# Patient Record
Sex: Female | Born: 1995 | Race: Black or African American | Hispanic: No | Marital: Single | Smoking: Never smoker
Health system: Southern US, Community
[De-identification: ages and names within clinical notes are randomized; demographics above are authoritative.]

## PROBLEM LIST (undated history)

## (undated) DIAGNOSIS — A6000 Herpesviral infection of urogenital system, unspecified: Secondary | ICD-10-CM

## (undated) DIAGNOSIS — T7840XA Allergy, unspecified, initial encounter: Secondary | ICD-10-CM

## (undated) DIAGNOSIS — R32 Unspecified urinary incontinence: Secondary | ICD-10-CM

## (undated) DIAGNOSIS — Z202 Contact with and (suspected) exposure to infections with a predominantly sexual mode of transmission: Secondary | ICD-10-CM

## (undated) HISTORY — DX: Contact with and (suspected) exposure to infections with a predominantly sexual mode of transmission: Z20.2

## (undated) HISTORY — DX: Herpesviral infection of urogenital system, unspecified: A60.00

## (undated) HISTORY — DX: Allergy, unspecified, initial encounter: T78.40XA

## (undated) HISTORY — DX: Unspecified urinary incontinence: R32

---
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
## 2015-11-19 ENCOUNTER — Emergency Department (HOSPITAL_COMMUNITY)
Admission: EM | Admit: 2015-11-19 | Discharge: 2015-11-19 | Disposition: A | Discharge status: Home / Self Care | Payer: Managed Care, Other (non HMO) | Source: Home / Self Care | Attending: Family Medicine | Admitting: Family Medicine

## 2015-11-19 ENCOUNTER — Other Ambulatory Visit (HOSPITAL_COMMUNITY)
Admission: RE | Admit: 2015-11-19 | Discharge: 2015-11-19 | Disposition: A | Discharge status: Home / Self Care | Payer: Managed Care, Other (non HMO) | Source: Ambulatory Visit | Attending: Family Medicine | Admitting: Family Medicine

## 2015-11-19 ENCOUNTER — Encounter (HOSPITAL_COMMUNITY): Payer: Self-pay | Admitting: *Deleted

## 2015-11-19 DIAGNOSIS — R69 Illness, unspecified: Secondary | ICD-10-CM

## 2015-11-19 DIAGNOSIS — B3731 Acute candidiasis of vulva and vagina: Secondary | ICD-10-CM

## 2015-11-19 DIAGNOSIS — B373 Candidiasis of vulva and vagina: Secondary | ICD-10-CM | POA: Diagnosis not present

## 2015-11-19 DIAGNOSIS — N760 Acute vaginitis: Secondary | ICD-10-CM

## 2015-11-19 DIAGNOSIS — Z113 Encounter for screening for infections with a predominantly sexual mode of transmission: Secondary | ICD-10-CM | POA: Diagnosis not present

## 2015-11-19 DIAGNOSIS — B9689 Other specified bacterial agents as the cause of diseases classified elsewhere: Secondary | ICD-10-CM

## 2015-11-19 DIAGNOSIS — A499 Bacterial infection, unspecified: Secondary | ICD-10-CM

## 2015-11-19 DIAGNOSIS — J111 Influenza due to unidentified influenza virus with other respiratory manifestations: Secondary | ICD-10-CM | POA: Diagnosis not present

## 2015-11-19 LAB — POCT URINALYSIS DIP (DEVICE)
Bilirubin Urine: NEGATIVE
Glucose, UA: NEGATIVE mg/dL
Ketones, ur: NEGATIVE mg/dL
NITRITE: NEGATIVE
PH: 7 (ref 5.0–8.0)
Protein, ur: 30 mg/dL — AB
Specific Gravity, Urine: 1.025 (ref 1.005–1.030)
Urobilinogen, UA: 0.2 mg/dL (ref 0.0–1.0)

## 2015-11-19 LAB — POCT PREGNANCY, URINE: Preg Test, Ur: NEGATIVE

## 2015-11-19 MED ORDER — METRONIDAZOLE 500 MG PO TABS: 500.0000 mg | ORAL_TABLET | Freq: Two times a day (BID) | ORAL | Status: DC

## 2015-11-19 MED ORDER — FLUCONAZOLE 150 MG PO TABS: 150.0000 mg | ORAL_TABLET | Freq: Once | ORAL | Status: DC

## 2015-11-19 MED ORDER — FLUCONAZOLE 150 MG PO TABS
150.0000 mg | ORAL_TABLET | Freq: Once | ORAL | Status: DC
Start: 2015-11-19 — End: 2015-11-19

## 2015-11-19 NOTE — ED Provider Notes (Signed)
CSN: [id number]     Arrival date & time 11/19/15  1307 History   First MD Initiated Contact with Patient 11/19/15 1412     Chief Complaint  Patient presents with  . Vaginal Discharge    HPI Sara Hutchinson is a 20 y.o. female presenting for vaginal irritation and discharge.   She reports 5 days of worsening vaginal discharge, burning, and itching. She tried miconazole topical last night and doesn't think that helped. Denies h/o STI's or yeast infections.  She is sexually active with female partners, uses condoms "80% of the time." Denies abdominal pain, but has last night developed dizziness, feeling hot (no temperature taken). She's had a cough and runny nose.   Past Medical History  Diagnosis Date  . Allergy    History reviewed. No pertinent past surgical history. History reviewed. No pertinent family history. Social History  Substance Use Topics  . Smoking status: Never Smoker   . Smokeless tobacco: Never Used  . Alcohol Use: 0.0 oz/week    0 Standard drinks or equivalent per week     Comment: rare   OB History    No data available     Review of Systems: As above  Allergies  Review of patient's allergies indicates no known allergies.  Home Medications   Prior to Admission medications   Not on File   Meds Ordered and Administered this Visit  Medications - No data to display  BP 128/77 mmHg  Pulse 111  Temp(Src) 100.5 F (38.1 C) (Oral)  Resp 16  SpO2 100%  LMP 11/12/2015  Physical Exam  Gen: Well-appearing 20 y.o.female in NAD CV: Tachycardic rate without murmur, good cap refill GI: +BS; soft, non-tender, non-distended Pelvic: External female genitalia without lesions, but excoriations. Vaginal mucosa and cervix normal without lesions. Cloudy discharge with thick white particulate pooled in vault. No blood. No cervical motion tenderness or masses.  Arcola Jansky, EMT present throughout duration of exam.   ED Course  Procedures (including critical care  time)  Labs Review Labs Reviewed  POCT URINALYSIS DIP (DEVICE) - Abnormal; Notable for the following:    Hgb urine dipstick TRACE (*)    Protein, ur 30 (*)    Leukocytes, UA LARGE (*)    All other components within normal limits  POCT PREGNANCY, URINE   Imaging Review No results found.  MDM   1. BV (bacterial vaginosis)   2. Vulvovaginal candidiasis   3. Influenza-like illness    Treat empirically for yeast and BV based on symptoms and consistent exam findings. Wet prep and GC/Chl pending. Will call with positive results. UA nonspecific without signs of infection. Dysuria likely related to urethral irritation from candida.   Patient likely has concomitant influenza like illness cause mild fever and tachycardia. Strongly doubt PID with no CMT/cervical discharge on exam or abd pain. Urged to return if she develops abdominal pain or worsening discharge.   This patient's case was discussed with the attending provider who examined the patient and helped formulate the plan of care.   Tyrone Nine, MD 11/19/15 [phone]

## 2015-11-19 NOTE — ED Notes (Addendum)
Pt  States   Slight  Vaginal  Discharge      X  2  Days       Pt  Was   Seen  Health  Dept    6  Feb   Had  Negative      Tests     Pt is  Here  Now     For  Possible  Yeast    Infection        Pt  Reports         Symptoms  Of  Vaginal burning         And  Irritation   Noted   She  Reports  Has  Small  Bumps  Present in the  Area

## 2015-11-19 NOTE — Discharge Instructions (Signed)
Take diflucan to treat the yeast infection. Repeat treatment in 7 days if symptoms persist. Take flagyl twice a day for 7 days to treat bacterial vaginosis. If you start feeling very sick, you should return for care.

## 2015-11-20 LAB — CERVICOVAGINAL ANCILLARY ONLY
Chlamydia: NEGATIVE
NEISSERIA GONORRHEA: NEGATIVE
WET PREP (BD AFFIRM): POSITIVE — AB

## 2015-11-21 IMAGING — CR DG KNEE COMPLETE 4+V*L*
3 series · 3 of 3 positions shown · non-contrast
Comparison: None.

CLINICAL DATA: Twisting injury left knee 08/05/2014 will walking.
Left knee pain.

EXAM:
LEFT KNEE - COMPLETE 4+ VIEW

[AP]
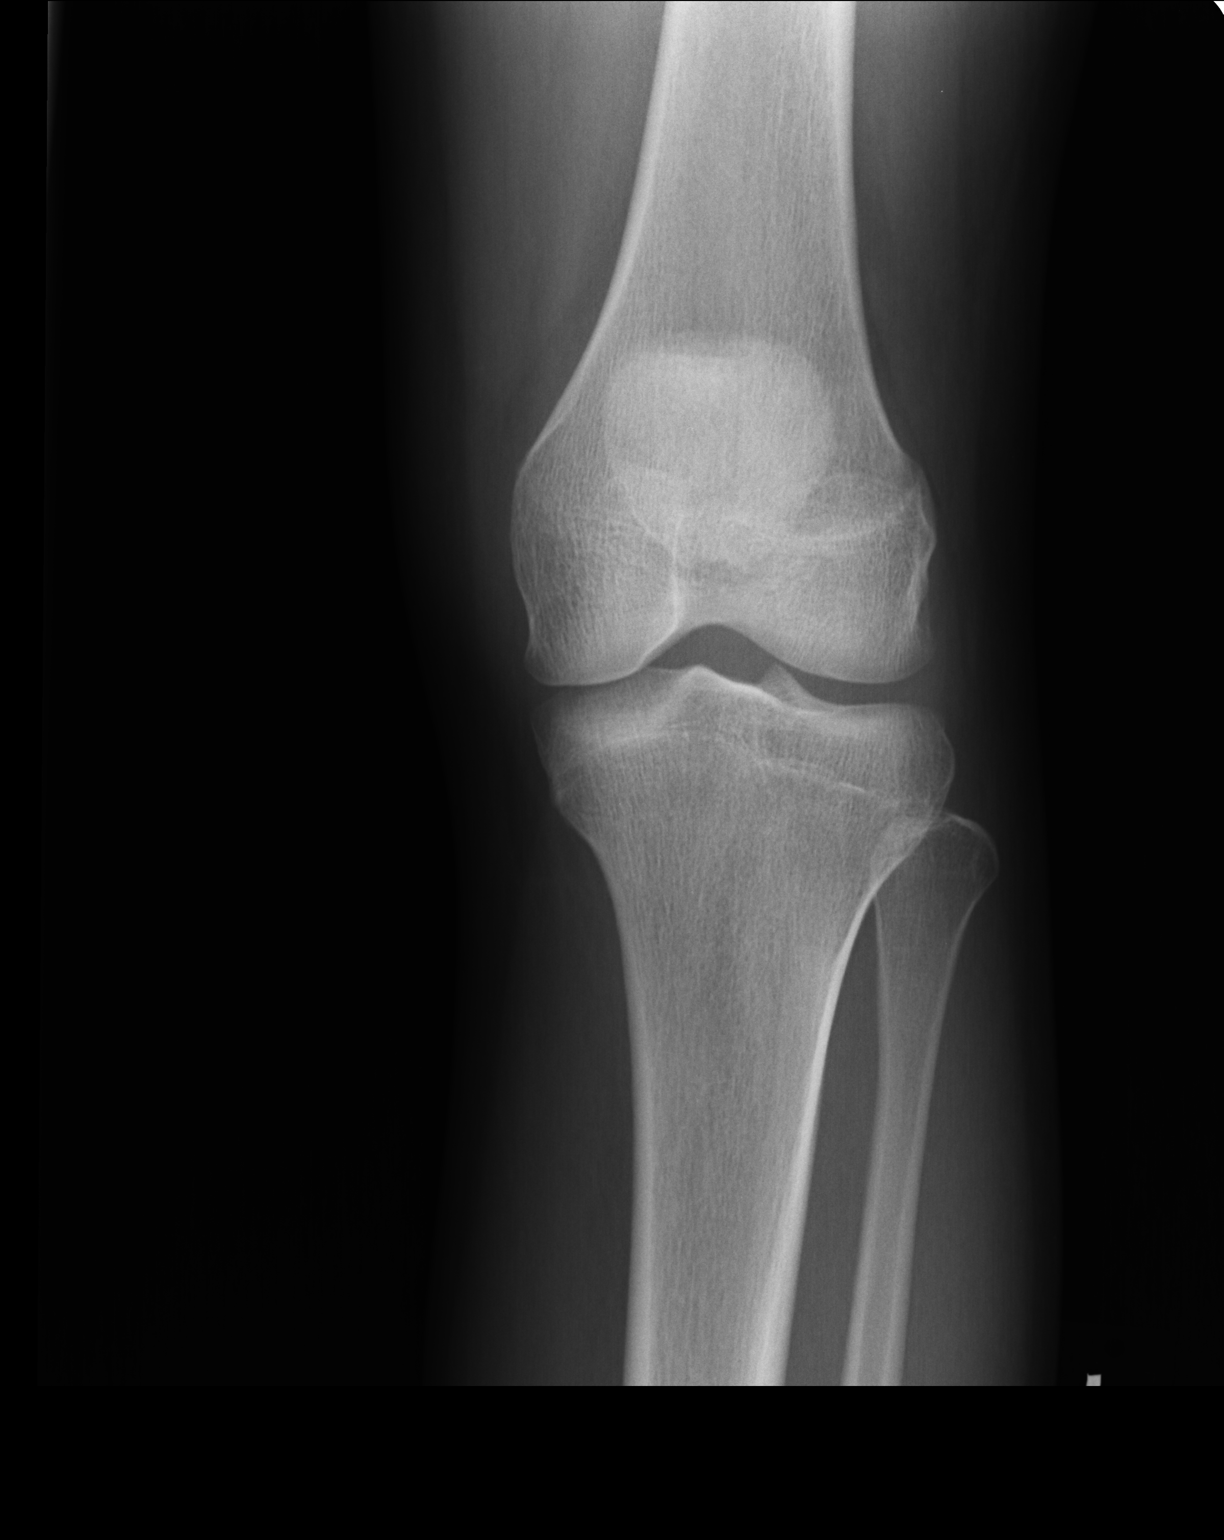

[lateral]
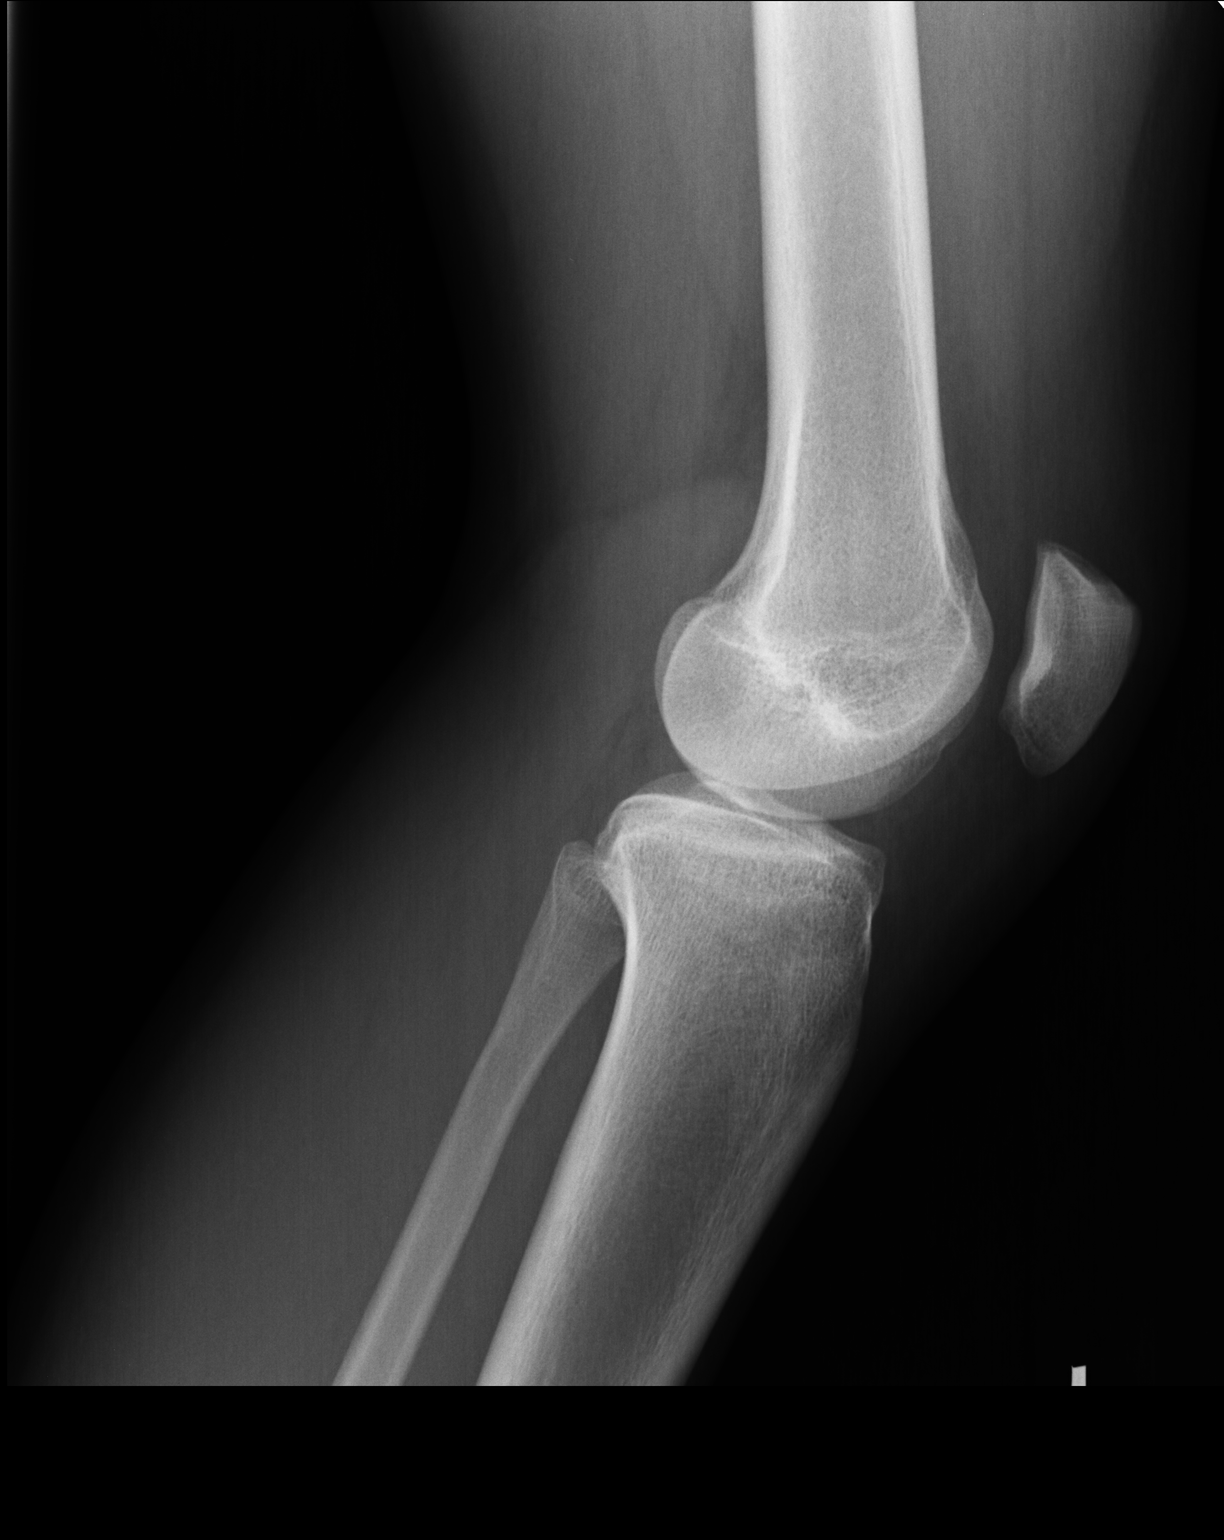

[ap ext rot]
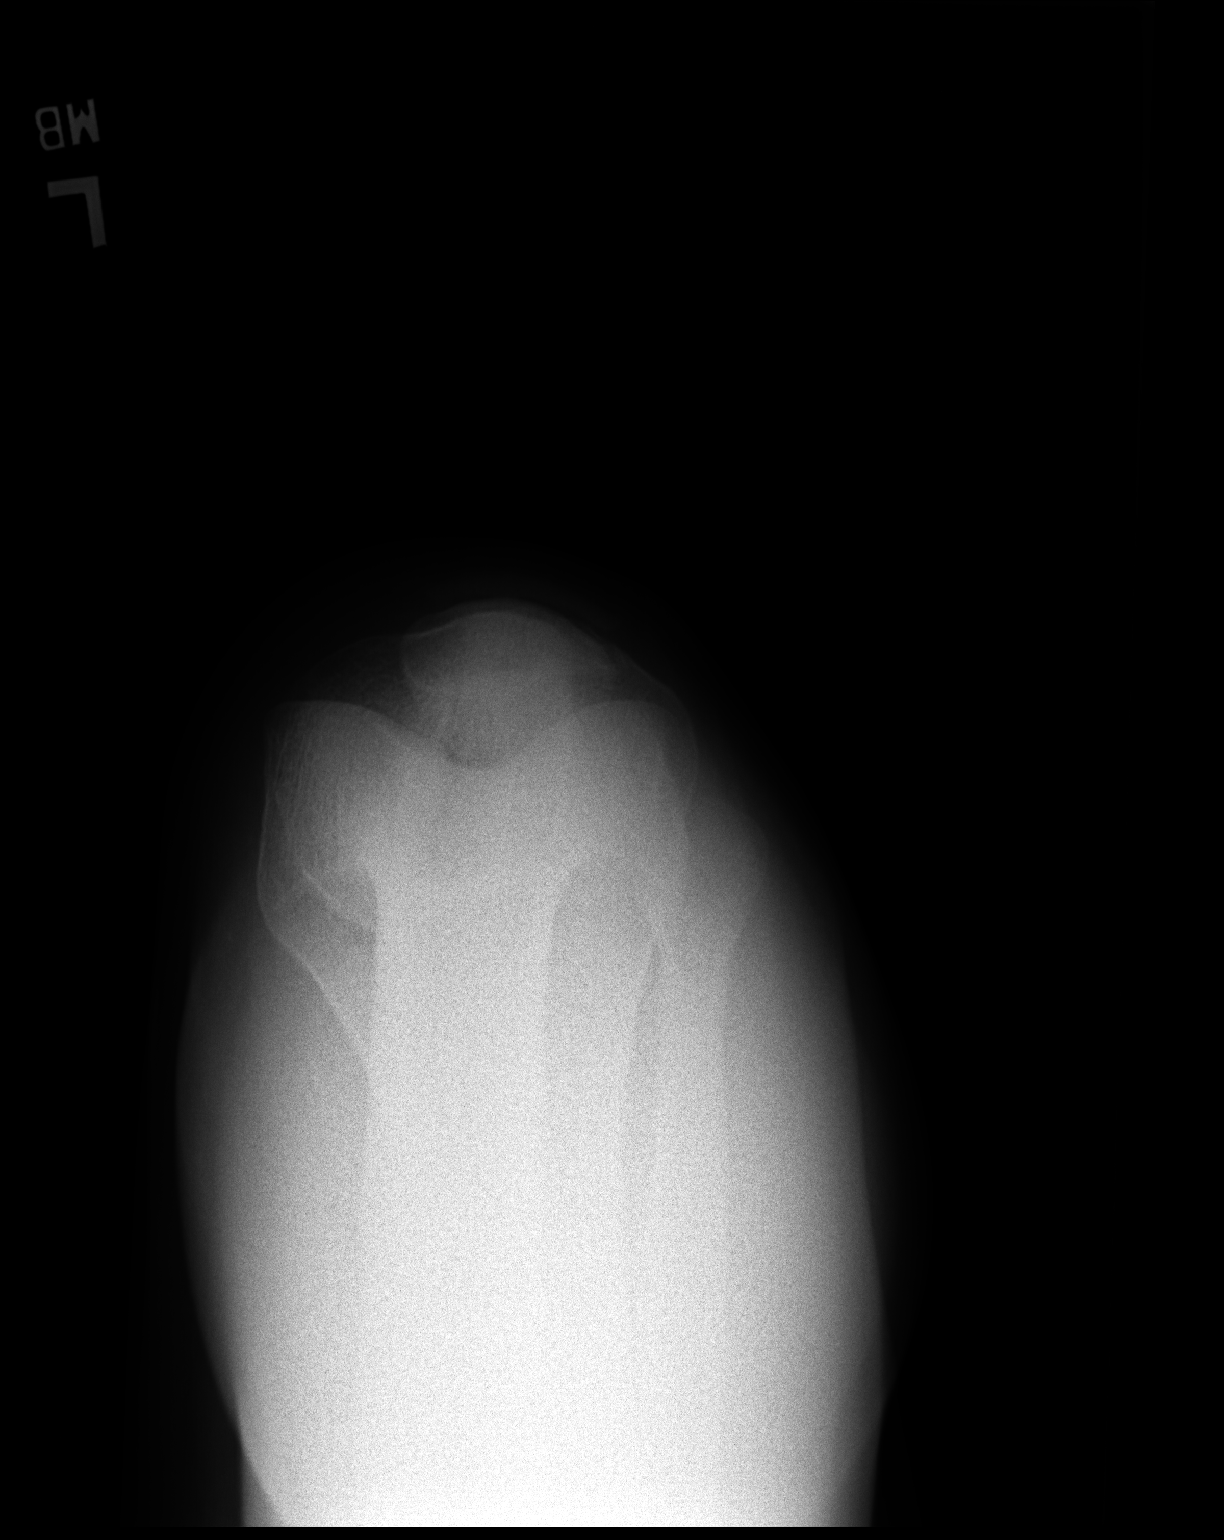

[3 of 3 positions shown; findings below may reference images not displayed]

FINDINGS: Imaged bones, joints and soft tissues appear normal.
IMPRESSION: Normal exam.

## 2015-11-23 ENCOUNTER — Telehealth (HOSPITAL_COMMUNITY): Payer: Self-pay | Admitting: *Deleted

## 2015-11-25 ENCOUNTER — Ambulatory Visit (INDEPENDENT_AMBULATORY_CARE_PROVIDER_SITE_OTHER): Payer: Managed Care, Other (non HMO) | Admitting: Obstetrics and Gynecology

## 2015-11-25 ENCOUNTER — Encounter: Payer: Self-pay | Admitting: Obstetrics and Gynecology

## 2015-11-25 VITALS — BP 118/70 | HR 80 | Resp 14 | Wt 120.0 lb

## 2015-11-25 DIAGNOSIS — A6000 Herpesviral infection of urogenital system, unspecified: Secondary | ICD-10-CM

## 2015-11-25 DIAGNOSIS — R300 Dysuria: Secondary | ICD-10-CM

## 2015-11-25 LAB — POCT URINALYSIS DIPSTICK
Bilirubin, UA: NEGATIVE
Blood, UA: NEGATIVE
Glucose, UA: NEGATIVE
Ketones, UA: NEGATIVE
NITRITE UA: NEGATIVE
PH UA: 8
PROTEIN UA: NEGATIVE
UROBILINOGEN UA: NEGATIVE

## 2015-11-25 MED ORDER — OXYCODONE-ACETAMINOPHEN 5-325 MG PO TABS: 1.0000 | ORAL_TABLET | ORAL | Status: DC | PRN

## 2015-11-25 MED ORDER — LIDOCAINE 5 % EX OINT: TOPICAL_OINTMENT | CUTANEOUS | Status: DC

## 2015-11-25 MED ORDER — VALACYCLOVIR HCL 1 G PO TABS: 1000.0000 mg | ORAL_TABLET | Freq: Two times a day (BID) | ORAL | Status: DC

## 2015-11-25 NOTE — Progress Notes (Signed)
Patient ID: Sara Hutchinson, female   DOB: 10-29-1995, 20 y.o.   MRN: [id number] GYNECOLOGY  VISIT   HPI: 20 y.o.   Single  African American  female   G0P0000 with Patient's last menstrual period was 11/06/2015.   here c/o severe vaginal irritation and dysuria. She was seen in the ER last week and treated for BV. She stated the bumps started about a week ago. She c/o increasing pain over the last several days. It was so painful to walk, the vulva is swollen. She has missed school. It hurts a lot to void. She feels she empties her bladder.  Last weeks she had flu like symptoms  GYNECOLOGIC HISTORY: Patient's last menstrual period was 11/06/2015. Contraception:none Menopausal hormone therapy: none         OB History    Gravida Para Term Preterm AB TAB SAB Ectopic Multiple Living           There are no active problems to display for this patient.   Past Medical History  Diagnosis Date  . Allergy   . Urinary incontinence     History reviewed. No pertinent past surgical history.  Current Outpatient Prescriptions  Medication Sig Dispense Refill  . metroNIDAZOLE (FLAGYL) 500 MG tablet Take 1 tablet (500 mg total) by mouth 2 (two) times daily. 14 tablet 0   No current facility-administered medications for this visit.     ALLERGIES: Review of patient's allergies indicates no known allergies.  History reviewed. No pertinent family history.  Social History   Social History  . Marital Status: Single    Spouse Name: N/A  . Number of Children: N/A  . Years of Education: N/A   Occupational History  . Not on file.   Social History Main Topics  . Smoking status: Never Smoker   . Smokeless tobacco: Never Used  . Alcohol Use: 0.0 oz/week    0 Standard drinks or equivalent per week     Comment: rare  . Drug Use: Yes    Special: Marijuana  . Sexual Activity:    Partners: Male   Other Topics Concern  . Not on file   Social History Narrative    Review of  Systems  Constitutional: Negative.   HENT: Negative.   Eyes: Negative.   Respiratory: Negative.   Cardiovascular: Negative.   Gastrointestinal: Negative.   Genitourinary: Positive for dysuria.       Vaginal irritation Vaginal discharge   Musculoskeletal: Negative.   Skin: Negative.   Neurological: Negative.   Endo/Heme/Allergies: Negative.   Psychiatric/Behavioral: Negative.     PHYSICAL EXAMINATION:    BP 118/70 mmHg  Pulse 80  Resp 14  Wt 120 lb (54.432 kg)  LMP 11/06/2015    General appearance: alert, cooperative and appears stated age  Pelvic: External genitalia:  The vulva and clitoris are swollen, multiple ulcers throughout, c/w primary HSV outbreak. Very painful to try and spread the vulva. Speculum exam was not performed              Chaperone was present for exam.  ASSESSMENT Primary HSV outbreak    PLAN Discussed voiding in a sitz bath Valtrex x 10 days Lidocaine ointment prn Can use Vaseline to the vulva as well Percocet for pain She understand if she can't void she needs to be seen  Discussed HSV, information given   An After Visit Summary was printed and given to the patient.  25 minutes  face to face time of which over 50% was spent in counseling.

## 2015-11-25 NOTE — Patient Instructions (Signed)

## 2015-11-26 ENCOUNTER — Telehealth (HOSPITAL_COMMUNITY): Payer: Self-pay | Admitting: Emergency Medicine

## 2015-11-26 NOTE — ED Notes (Signed)
Attempt  LM on pt's VM Need to give lab results from recent visit on   Per Dr. Dayton Scrape,    Will try later.  Faxed documentation to [facility]

## 2015-11-27 LAB — HERPES SIMPLEX VIRUS CULTURE: ORGANISM ID, BACTERIA: DETECTED

## 2015-11-28 NOTE — ED Notes (Unsigned)
Called pt and notified of recent lab results from visit 2/21 Pt ID'd properly... Reports feeling better and sx have subsided Also reports she went to GYN and was dx w/HSV  Per Dr. Dayton Scrape,  Test for gardnerella (bacterial vaginosis) positive. Pt received rx for metronidazole at [facility] visit 11/19/15. Recheck for persistent symptoms.  Tests for gonorrhea/chlamydia were negative. Please let patient know. LM  Adv pt if sx are not getting better to return  Pt verb understanding Education on safe sex given

## 2016-02-27 DIAGNOSIS — A6000 Herpesviral infection of urogenital system, unspecified: Secondary | ICD-10-CM

## 2016-02-27 HISTORY — DX: Herpesviral infection of urogenital system, unspecified: A60.00

## 2016-05-23 ENCOUNTER — Ambulatory Visit (HOSPITAL_COMMUNITY)
Admission: EM | Admit: 2016-05-23 | Discharge: 2016-05-23 | Disposition: A | Discharge status: Home / Self Care | Payer: Managed Care, Other (non HMO) | Attending: Physician Assistant | Admitting: Physician Assistant

## 2016-05-23 ENCOUNTER — Encounter (HOSPITAL_COMMUNITY): Payer: Self-pay | Admitting: Emergency Medicine

## 2016-05-23 DIAGNOSIS — IMO0002 Reserved for concepts with insufficient information to code with codable children: Secondary | ICD-10-CM

## 2016-05-23 NOTE — Discharge Instructions (Signed)
Do not submerge in water for 1 day, then ok to shower. Keep clean and let the steri-strips come off on their own. F/U if any signs of infection.

## 2016-05-23 NOTE — ED Triage Notes (Signed)
Patient reports stepping on glass around 3:00 pm.  Piece of glass went through shoe and into the bottom of left foot, ball of the foot.  Patient reports pulling out glass and does not think there is any glass in wound.  Patient concerned for depth of wound and circumstances surrounding incident.  Currently bleeding controlled.  Patient did take a shower after returning from dumpster.

## 2016-05-23 NOTE — ED Provider Notes (Signed)
CSN: [id number]     Arrival date & time 05/23/16  1630 History   None    Chief Complaint  Patient presents with  . Laceration   (Consider location/radiation/quality/duration/timing/severity/associated sxs/prior Treatment) Patient presents with a laceration to the left foot. She stepping on a piece of glass at work, went through the slide. She pulled glass out. Bleeding has subsided.       Past Medical History:  Diagnosis Date  . Allergy   . Urinary incontinence    History reviewed. No pertinent surgical history. History reviewed. No pertinent family history. Social History  Substance Use Topics  . Smoking status: Never Smoker  . Smokeless tobacco: Never Used  . Alcohol use 0.0 oz/week     Comment: rare   OB History    Gravida Para Term Preterm AB Living   0 0 0 0 0 0   SAB TAB Ectopic Multiple Live Births   0 0 0 0       Review of Systems  All other systems reviewed and are negative.   Allergies  Review of patient's allergies indicates no known allergies.  Home Medications   Prior to Admission medications   Medication Sig Start Date End Date Taking? Authorizing Provider  lidocaine (XYLOCAINE) 5 % ointment Use a pea sized amount TID as needed 11/25/15   Romualdo Bolk, MD  metroNIDAZOLE (FLAGYL) 500 MG tablet Take 1 tablet (500 mg total) by mouth 2 (two) times daily. 11/19/15   Tyrone Nine, MD  oxyCODONE-acetaminophen (PERCOCET) 5-325 MG tablet Take 1-2 tablets by mouth every 4 (four) hours as needed. use only as much as needed to relieve pain 11/25/15   Romualdo Bolk, MD  valACYclovir (VALTREX) 1000 MG tablet Take 1 tablet (1,000 mg total) by mouth 2 (two) times daily. Take for 10 days 11/25/15   Romualdo Bolk, MD   Meds Ordered and Administered this Visit  Medications - No data to display  BP 134/80 (BP Location: Left Arm)   Pulse 96   Temp 99.2 F (37.3 C) (Oral)   Resp 16   SpO2 100%  No data found.   Physical Exam  Constitutional: She  is oriented to person, place, and time. She appears well-developed and well-nourished.  Neurological: She is alert and oriented to person, place, and time.  Skin: Skin is warm and dry.  2-3 cm superficial laceration to the sole of left foot, no FB is noted  Psychiatric: Her behavior is normal.  Nursing note and vitals reviewed.   Urgent Care Course   Clinical Course    .Marland KitchenLaceration Repair Date/Time: 05/23/2016 7:00 PM Performed by: Riki Sheer Authorized by: Riki Sheer   Consent:    Consent obtained:  Verbal   Consent given by:  Patient Anesthesia (see MAR for exact dosages):    Anesthesia method:  None Laceration details:    Location:  Foot   Foot location:  Sole of L foot   Length (cm):  3 Repair type:    Repair type:  Simple Exploration:    Contaminated: no   Treatment:    Area cleansed with:  Betadine   Amount of cleaning:  Standard Skin repair:    Repair method:  Tissue adhesive Approximation:    Approximation:  Close Post-procedure details:    Dressing:  Adhesive bandage   (including critical care time)  Labs Review Labs Reviewed - No data to display  Imaging Review No results found.   Visual Acuity Review  Right Eye Distance:   Left Eye Distance:   Bilateral Distance:    Right Eye Near:   Left Eye Near:    Bilateral Near:         MDM   1. Laceration   Simple closer with dermabond. Wound care discussed. F/U if needed. Td is up to date.    Riki SheerMichelle G Shylin Keizer, PA-C 05/23/16 1921

## 2016-05-25 ENCOUNTER — Telehealth (HOSPITAL_COMMUNITY): Payer: Self-pay | Admitting: Emergency Medicine

## 2016-05-25 NOTE — Telephone Encounter (Signed)
Pt called and wanted to know if she can get crutches from her visit on 8/26... Told her she can and also gave her some other options.  Reports she will call around and if not, she will come here to get crutches.

## 2016-07-02 ENCOUNTER — Encounter: Payer: Self-pay | Admitting: Obstetrics and Gynecology

## 2016-09-28 DIAGNOSIS — Z202 Contact with and (suspected) exposure to infections with a predominantly sexual mode of transmission: Secondary | ICD-10-CM

## 2016-09-28 HISTORY — DX: Contact with and (suspected) exposure to infections with a predominantly sexual mode of transmission: Z20.2

## 2016-10-09 ENCOUNTER — Ambulatory Visit (INDEPENDENT_AMBULATORY_CARE_PROVIDER_SITE_OTHER): Payer: Managed Care, Other (non HMO) | Admitting: Obstetrics & Gynecology

## 2016-10-09 ENCOUNTER — Telehealth: Payer: Self-pay | Admitting: Obstetrics and Gynecology

## 2016-10-09 ENCOUNTER — Encounter: Payer: Self-pay | Admitting: Obstetrics & Gynecology

## 2016-10-09 VITALS — BP 100/60 | HR 60 | Resp 14 | Ht 66.0 in | Wt 127.0 lb

## 2016-10-09 DIAGNOSIS — N898 Other specified noninflammatory disorders of vagina: Secondary | ICD-10-CM

## 2016-10-09 DIAGNOSIS — N938 Other specified abnormal uterine and vaginal bleeding: Secondary | ICD-10-CM

## 2016-10-09 DIAGNOSIS — Z7251 High risk heterosexual behavior: Secondary | ICD-10-CM

## 2016-10-09 MED ORDER — IBUPROFEN 800 MG PO TABS: 800.0000 mg | ORAL_TABLET | Freq: Three times a day (TID) | ORAL | 0 refills | Status: DC | PRN

## 2016-10-09 NOTE — Telephone Encounter (Signed)
Spoke with patient. Patient tearful. Patient states she was sexually active 10/03/16 with no protection. Patient states she took plan B on 10/04/16. Patient reports LMP 09/30/16. Patient states after taking plan B she experienced diarrhea for 2 days, tired and continued to bleed when she thought cycle should be over. Patient states she has missed class today d/t a continue right sided stomach pain she has been experiencing when she moves. Patient states pain is 5/10 and is sharp with movement. Patient states she is not sure where to go or what to do. Patient states she has spoken with friends that have taken plan b and have not had these type of side effects. Reviewed common side effects of plan b with patient: nausea, diarrhea, dizziness, fatigue, breast pain-tenderness, changes in cycles and headache. Recommenced OV for further evaluation. Advised patient Dr. Oscar LaJertson is out of the office today, can schedule with covering provider. Patient scheduled for 1/12 at 2pm with Dr. Hyacinth MeekerMiller. Patient is agreeable to date and time. Last OV 11/25/15.  Routing to provider for final review. Patient is agreeable to disposition. Will close encounter.  Cc: Dr. Oscar LaJertson

## 2016-10-09 NOTE — Progress Notes (Signed)
. GYNECOLOGY  VISIT   HPI: 21 y.o. G0P0000 Single African American female with complaint of right sided pain that started two or three days after taking plan B on Sunday.    She reports she was using a condom when she had intercourse but the condom broke and she decided to use Plan B.  Reports she had some diarrhea and fatigue after taking this.  She said she slept for "hours" for several days.  Has felt some right sided pain (right side beneath ribs) and wonders if this was due to "all the sleep".  She has take some OTC Advil and this has helped.  She thought her LMP was 09/30/16 but reports it was very light for a few days.  Then after the plan B, the bleeding has increased.  She is wearing a pad today.  Denies pain.  Reports she really "dislikes having to have an exam while bleeding".  Voiced understanding.    Reports she had a little nausea and headache for a day after the plan B but that has resolved.  She does feel like she's had recurrent BV symptoms.  Has been treated earlier this year.  Pt cannot tell about symptoms today because she is having bleeding but felt the BV was there before the bleeding started.  She's had two or three partners since last STD testing.  Recommend she proceed today.  GYNECOLOGIC HISTORY: Patient's last menstrual period was 09/30/2016. Contraception:  none  There are no active problems to display for this patient.   Past Medical History:  Diagnosis Date  . Allergy   . Urinary incontinence     History reviewed. No pertinent surgical history.  MEDS:  Reviewed in EPIC and UTD  ALLERGIES: Patient has no known allergies.  History reviewed. No pertinent family history.  SH:  Single, non smoker  Review of Systems  All other systems reviewed and are negative.   PHYSICAL EXAMINATION:    BP 100/60 (BP Location: Right Arm, Patient Position: Sitting, Cuff Size: Normal)   Pulse 60   Resp 14   Ht 5\' 6"  (1.676 m)   Wt 127 lb (57.6 kg)   LMP  09/30/2016   BMI 20.50 kg/m     General appearance: alert, cooperative and appears stated age Abdomen: soft, non-tender; bowel sounds normal; no masses,  no organomegaly  Pelvic: External genitalia:  no lesions              Urethra:  normal appearing urethra with no masses, tenderness or lesions              Bartholins and Skenes: normal                 Vagina: normal appearing vagina with normal color and discharge, no lesions, blood present              Cervix: no lesions              Bimanual Exam:  Uterus:  normal size, contour, position, consistency, mobility, non-tender              Adnexa: no mass, fullness, tenderness              Anus:  no lesions  Chaperone was present for exam.  Assessment: DUB after Plan B Unprotected intercourse with pregnancy STD exposure Possible BV H/O genital herpes  Plan: Motrin 800mg  every 8 hours prn UPT.  Pt aware if neg, should repeat in one week with OTC pregnancy  test. GC/Chl obtained.  Declines blood work for HIV/RPR today. Affirm pending.  Results will be called to pt.  ~25 minutes spent with patient >50% of time was in face to face discussion of above.  Pt needed lots of reassurance today regarding risks and testing that should be done.

## 2016-10-09 NOTE — Telephone Encounter (Signed)
Patient needs to speak with a nurse states she took Plan B on Sunday morning.  States she was on her period when she took the pill and her period has lasted 4 days longer than normal.  Says she felt fine all day Sunday but began having loose stool Sunday night that went on for 3 days with cramping.  The past 2 days she has had a sharp pain in her right side that is constant.  She wants to know what to do and is this nomal.

## 2016-10-10 ENCOUNTER — Encounter: Payer: Self-pay | Admitting: Obstetrics & Gynecology

## 2016-10-10 LAB — GC/CHLAMYDIA PROBE AMP
CT Probe RNA: DETECTED — AB
GC Probe RNA: NOT DETECTED

## 2016-10-10 LAB — WET PREP BY MOLECULAR PROBE
CANDIDA SPECIES: NEGATIVE
GARDNERELLA VAGINALIS: POSITIVE — AB
TRICHOMONAS VAG: NEGATIVE

## 2016-10-12 ENCOUNTER — Other Ambulatory Visit: Payer: Self-pay | Admitting: *Deleted

## 2016-10-12 DIAGNOSIS — Z113 Encounter for screening for infections with a predominantly sexual mode of transmission: Secondary | ICD-10-CM

## 2016-10-12 MED ORDER — METRONIDAZOLE 500 MG PO TABS: 500.0000 mg | ORAL_TABLET | Freq: Two times a day (BID) | ORAL | 0 refills | Status: DC

## 2016-10-12 MED ORDER — AZITHROMYCIN 1 G PO PACK: 1.0000 g | PACK | Freq: Once | ORAL | 0 refills | Status: AC

## 2016-10-20 ENCOUNTER — Other Ambulatory Visit (INDEPENDENT_AMBULATORY_CARE_PROVIDER_SITE_OTHER): Payer: Managed Care, Other (non HMO)

## 2016-10-20 DIAGNOSIS — Z113 Encounter for screening for infections with a predominantly sexual mode of transmission: Secondary | ICD-10-CM

## 2016-10-21 LAB — STD PANEL
HIV: NONREACTIVE
Hepatitis B Surface Ag: NEGATIVE

## 2017-01-12 ENCOUNTER — Ambulatory Visit: Payer: Self-pay | Admitting: Obstetrics & Gynecology

## 2017-01-12 ENCOUNTER — Encounter: Payer: Self-pay | Admitting: Obstetrics & Gynecology

## 2017-01-12 ENCOUNTER — Telehealth: Payer: Self-pay | Admitting: Obstetrics & Gynecology

## 2017-01-12 NOTE — Telephone Encounter (Signed)
Patient forgot her appointment for "TOC chlamydia today." I called her and she said, "Oh, I forgot about that." Patient rescheduled to 01/15/17 with Dr. Hyacinth Meeker. I took the opportunity to remind her of our appointment policy as well.  Routing to Dr. Hyacinth Meeker for FYI only. Closing encounter.

## 2017-01-15 ENCOUNTER — Ambulatory Visit (INDEPENDENT_AMBULATORY_CARE_PROVIDER_SITE_OTHER): Payer: Managed Care, Other (non HMO) | Admitting: Obstetrics & Gynecology

## 2017-01-15 ENCOUNTER — Encounter: Payer: Self-pay | Admitting: Obstetrics & Gynecology

## 2017-01-15 VITALS — BP 100/68 | HR 100 | Resp 16 | Ht 66.0 in | Wt 129.0 lb

## 2017-01-15 DIAGNOSIS — Z8619 Personal history of other infectious and parasitic diseases: Secondary | ICD-10-CM

## 2017-01-15 NOTE — Progress Notes (Signed)
Dictation #1 [id number]  [id number]:[id number] GYNECOLOGY  VISIT   HPI: 21 y.o. G0P0000 Single African American female here for follow-up after having positive chlamydia infection noted in January.  She is sexually active.  Partner did not go an get tested or treated.  She states they always use condoms.  Then she states she thinks she  "might" have forgotten.  She did take the antibiotics without any issues.    Still feels she has discharge.  No pelvic pain.  Denies fever.  Denies urinary symptoms.  Declines using something else for contraception.  GYNECOLOGIC HISTORY: Patient's last menstrual period was 12/29/2016. Contraception: condoms  Menopausal hormone therapy: None  There are no active problems to display for this patient.   Past Medical History:  Diagnosis Date  . Allergy   . 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 Urinary incontinence     No past surgical history on file.  MEDS:  Reviewed in EPIC and UTD  ALLERGIES: Patient has no known allergies.  No family history on file.  SH:  Single, non smoker  Review of Systems  All other systems reviewed and are negative.   PHYSICAL EXAMINATION:    BP 100/68 (BP Location: Right Arm, Patient Position: Sitting, Cuff Size: Normal)   Pulse 100   Resp 16   Ht  (1.676 m)   Wt 129 lb (58.5 kg)   LMP 12/29/2016   BMI 20.82 kg/m     General appearance: alert, cooperative and appears stated age CV:  Regular rate and rhythm Lungs:  clear to auscultation, no wheezes, rales or rhonchi, symmetric air entry Abdomen: soft, non-tender; bowel sounds normal; no masses,  no organomegaly  Pelvic: External genitalia:  no lesions              Urethra:  normal appearing urethra with no masses, tenderness or lesions              Bartholins and Skenes: normal                 Vagina: normal appearing vagina with normal color and discharge, no lesions              Cervix: no lesions              Bimanual Exam:  Uterus:  normal  size, contour, position, consistency, mobility, non-tender              Adnexa: no mass, fullness, tenderness              Anus:  no lesions  Chaperone was present for exam.  Assessment: h/o chlamydia infection Unprotected intercourse Declines changes in contraception  Plan: GC/Chl obtained today

## 2017-01-16 LAB — GC/CHLAMYDIA PROBE AMP
CT Probe RNA: NOT DETECTED
GC Probe RNA: NOT DETECTED

## 2019-07-26 ENCOUNTER — Other Ambulatory Visit: Payer: Self-pay | Admitting: Obstetrics and Gynecology

## 2019-07-26 ENCOUNTER — Ambulatory Visit: Payer: Medicaid Other | Admitting: Obstetrics and Gynecology

## 2019-07-26 ENCOUNTER — Other Ambulatory Visit: Payer: Self-pay

## 2019-07-26 ENCOUNTER — Encounter: Payer: Self-pay | Admitting: Obstetrics and Gynecology

## 2019-07-26 VITALS — BP 122/80 | HR 76 | Temp 97.4°F | Wt 129.2 lb

## 2019-07-26 DIAGNOSIS — N898 Other specified noninflammatory disorders of vagina: Secondary | ICD-10-CM

## 2019-07-26 DIAGNOSIS — N760 Acute vaginitis: Secondary | ICD-10-CM | POA: Diagnosis not present

## 2019-07-26 DIAGNOSIS — Z3009 Encounter for other general counseling and advice on contraception: Secondary | ICD-10-CM

## 2019-07-26 DIAGNOSIS — B9689 Other specified bacterial agents as the cause of diseases classified elsewhere: Secondary | ICD-10-CM

## 2019-07-26 DIAGNOSIS — Z113 Encounter for screening for infections with a predominantly sexual mode of transmission: Secondary | ICD-10-CM

## 2019-07-26 MED ORDER — ULIPRISTAL ACETATE 30 MG PO TABS: 1.0000 | ORAL_TABLET | Freq: Once | ORAL | 0 refills | Status: AC

## 2019-07-26 MED ORDER — METRONIDAZOLE 0.75 % VA GEL: 1.0000 | Freq: Every day | VAGINAL | 0 refills | Status: DC

## 2019-07-26 NOTE — Patient Instructions (Addendum)
Vaginitis Vaginitis is a condition in which the vaginal tissue swells and becomes red (inflamed). This condition is most often caused by a change in the normal balance of bacteria and yeast that live in the vagina. This change causes an overgrowth of certain bacteria or yeast, which causes the inflammation. There are different types of vaginitis, but the most common types are:  Bacterial vaginosis.  Yeast infection (candidiasis).  Trichomoniasis vaginitis. This is a sexually transmitted disease (STD).  Viral vaginitis.  Atrophic vaginitis.  Allergic vaginitis. What are the causes? The cause of this condition depends on the type of vaginitis. It can be caused by:  Bacteria (bacterial vaginosis).  Yeast, which is a fungus (yeast infection).  A parasite (trichomoniasis vaginitis).  A virus (viral vaginitis).  Low hormone levels (atrophic vaginitis). Low hormone levels can occur during pregnancy, breastfeeding, or after menopause.  Irritants, such as bubble baths, scented tampons, and feminine sprays (allergic vaginitis). Other factors can change the normal balance of the yeast and bacteria that live in the vagina. These include:  Antibiotic medicines.  Poor hygiene.  Diaphragms, vaginal sponges, spermicides, birth control pills, and intrauterine devices (IUD).  Sex.  Infection.  Uncontrolled diabetes.  A weakened defense (immune) system. What increases the risk? This condition is more likely to develop in women who:  Smoke.  Use vaginal douches, scented tampons, or scented sanitary pads.  Wear tight-fitting pants.  Wear thong underwear.  Use oral birth control pills or an IUD.  Have sex without a condom.  Have multiple sex partners.  Have an STD.  Frequently use the spermicide nonoxynol-9.  Eat lots of foods high in sugar.  Have uncontrolled diabetes.  Have low estrogen levels.  Have a weakened immune system from an immune disorder or medical  treatment.  Are pregnant or breastfeeding. What are the signs or symptoms? Symptoms vary depending on the cause of the vaginitis. Common symptoms include:  Abnormal vaginal discharge. ? The discharge is white, gray, or yellow with bacterial vaginosis. ? The discharge is thick, white, and cheesy with a yeast infection. ? The discharge is frothy and yellow or greenish with trichomoniasis.  A bad vaginal smell. The smell is fishy with bacterial vaginosis.  Vaginal itching, pain, or swelling.  Sex that is painful.  Pain or burning when urinating. Sometimes there are no symptoms. How is this diagnosed? This condition is diagnosed based on your symptoms and medical history. A physical exam, including a pelvic exam, will also be done. You may also have other tests, including:  Tests to determine the pH level (acidity or alkalinity) of your vagina.  A whiff test, to assess the odor that results when a sample of your vaginal discharge is mixed with a potassium hydroxide solution.  Tests of vaginal fluid. A sample will be examined under a microscope. How is this treated? Treatment varies depending on the type of vaginitis you have. Your treatment may include:  Antibiotic creams or pills to treat bacterial vaginosis and trichomoniasis.  Antifungal medicines, such as vaginal creams or suppositories, to treat a yeast infection.  Medicine to ease discomfort if you have viral vaginitis. Your sexual partner should also be treated.  Estrogen delivered in a cream, pill, suppository, or vaginal ring to treat atrophic vaginitis. If vaginal dryness occurs, lubricants and moisturizing creams may help. You may need to avoid scented soaps, sprays, or douches.  Stopping use of a product that is causing allergic vaginitis. Then using a vaginal cream to treat the symptoms. Follow   these instructions at home: Lifestyle  Keep your genital area clean and dry. Avoid soap, and only rinse the area with  water.  Do not douche or use tampons until your health care provider says it is okay to do so. Use sanitary pads, if needed.  Do not have sex until your health care provider approves. When you can return to sex, practice safe sex and use condoms.  Wipe from front to back. This avoids the spread of bacteria from the rectum to the vagina. General instructions  Take over-the-counter and prescription medicines only as told by your health care provider.  If you were prescribed an antibiotic medicine, take or use it as told by your health care provider. Do not stop taking or using the antibiotic even if you start to feel better.  Keep all follow-up visits as told by your health care provider. This is important. How is this prevented?  Use mild, non-scented products. Do not use things that can irritate the vagina, such as fabric softeners. Avoid the following products if they are scented: ? Feminine sprays. ? Detergents. ? Tampons. ? Feminine hygiene products. ? Soaps or bubble baths.  Let air reach your genital area. ? Wear cotton underwear to reduce moisture buildup. ? Avoid wearing underwear while you sleep. ? Avoid wearing tight pants and underwear or nylons without a cotton panel. ? Avoid wearing thong underwear.  Take off any wet clothing, such as bathing suits, as soon as possible.  Practice safe sex and use condoms. Contact a health care provider if:  You have abdominal pain.  You have a fever.  You have symptoms that last for more than 2-3 days. Get help right away if:  You have a fever and your symptoms suddenly get worse. Summary  Vaginitis is a condition in which the vaginal tissue becomes inflamed.This condition is most often caused by a change in the normal balance of bacteria and yeast that live in the vagina.  Treatment varies depending on the type of vaginitis you have.  Do not douche, use tampons , or have sex until your health care provider approves. When  you can return to sex, practice safe sex and use condoms. This information is not intended to replace advice given to you by your health care provider. Make sure you discuss any questions you have with your health care provider. Document Released: 07/12/2007 Document Revised: 08/27/2017 Document Reviewed: 10/20/2016 Elsevier Patient Education  2020 [employer]. Emergency Contraception Emergency contraception refers to birth control methods that prevent pregnancy after unprotected sex. Emergency contraception may be recommended:  When a condom breaks.  After a sexual assault.  If you forgot to take your birth control pill.  If you used inadequate contraception during sex. Emergency contraception is most effective if used as soon as possible after sex. It is important to note that it does not protect against STIs (sexually transmitted infections). Do not use emergency contraception as your only form of birth control. Types of emergency contraceptives Emergency contraception must be used within 5 days after having unprotected sex. The following types of emergency contraception are available:  Hormonal pills that work by preventing the ovaries from releasing an egg (ovulation) and preventing fertilization of an egg. There are two types of hormonal pills: ? A pill that contains high doses of estrogen and progesterone. ? A pill that contains only progesterone. This may be a single pill or two pills taken 12-24 hours apart.  A non-hormonal pill that works by preventing  progesterone from having its normal effect on ovulation and the lining of the uterus (endometrium). A prescription for this medicine is usually required.  A non-hormonal medical device that is inserted into the uterus (copper intrauterine device, IUD). The copper in the IUD causes the sperm to be less able to fertilize the egg. A health care provider must insert the IUD. Most types of emergency contraceptives are available without  a prescription or a visit with your health care provider. If you are younger than 59, you may need a prescription. Talk with your pharmacist about your options. Side effects Ask your health care provider about the possible side effects of emergency contraceptives. Side effects may include:  Abdominal pain and cramps.  Nausea and vomiting.  Breast tenderness.  Headache.  Dizziness.  Fatigue.  Irregular bleeding or spotting. If you take emergency contraception while you are pregnant, it will not end your pregnancy or harm your baby. Follow these instructions at home:  Take over-the-counter and prescription medicines only as told by your health care provider.  Eat something before taking the emergency contraception pills. This can help prevent nausea.  If you feel tired or dizzy, rest until you feel better.  Continue using your normal method of birth control. Contact a health care provider if:  You vomit within 2 hours after taking a pill. You may need to take another pill.  You have a severe headache.  You have bleeding that does not stop.  It has been 21 days since you took an emergency contraception pill and you have not had a menstrual period. Get help right away if:  You have chest pain.  You have leg pain.  You have numbness or weakness of your arms or legs.  You have slurred speech.  You have visual problems. Summary  Emergency contraceptives prevent pregnancy after unprotected sex.  Emergency contraception will not work if you are already pregnant and will not harm the baby if you are pregnant.  Some emergency contraceptives are available from your local pharmacy without a prescription.  Talk to your health care provider about the type of emergency contraceptives that are best for you. This information is not intended to replace advice given to you by your health care provider. Make sure you discuss any questions you have with your health care provider.  Document Released: 11/23/2001 Document Revised: 08/27/2017 Document Reviewed: 10/27/2016 Elsevier Patient Education  2020 [employer].

## 2019-07-26 NOTE — Addendum Note (Signed)
Addended by: Gwendlyn Deutscher on: 07/26/2019 02:42 PM   Modules accepted: Orders

## 2019-07-26 NOTE — Progress Notes (Signed)
GYNECOLOGY  VISIT   HPI: 23 y.o.   Single Black or African American Not Hispanic or Latino  female   G0P0000 with Patient's last menstrual period was 07/13/2019 (exact date).   here for STD testing.  She wants to get tested every 3 months. Mostly uses condoms.  She has noticed a slight vaginal odor occasionally.   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   GYNECOLOGIC HISTORY: Patient's last menstrual period was 07/13/2019 (exact date). Contraception:Condoms Menopausal hormone therapy: None        OB History    Gravida  0   Para  0   Term  0   Preterm  0   AB  0   Living  0     SAB  0   TAB  0   Ectopic  0   Multiple  0   Live Births                 There are no active problems to display for this patient.   Past Medical History:  Diagnosis Date  . Allergy   . Chlamydia contact, treated 09/2016  . 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 Urinary incontinence     History reviewed. No pertinent surgical history.  Current Outpatient Medications  Medication Sig Dispense Refill  . ibuprofen (ADVIL,MOTRIN) 800 MG tablet Take 1 tablet (800 mg total) by mouth every 8 (eight) hours as needed. 30 tablet 0   No current facility-administered medications for this visit.      ALLERGIES: Patient has no known allergies.  History reviewed. No pertinent family history.  Social History   Socioeconomic History  . Marital status: Single    Spouse name: Not on file  . Number of children: Not on file  . Years of education: Not on file  . Highest education level: Not on file  Occupational History  . Not on file  Social Needs  . Financial resource strain: Not on file  . Food insecurity    Worry: Not on file    Inability: Not on file  . Transportation needs    Medical: Not on file    Non-medical: Not on file  Tobacco Use  . Smoking status: Never Smoker  . Smokeless tobacco:  Never Used  Substance and Sexual Activity  . Alcohol use: Yes    Alcohol/week: 0.0 standard drinks    Comment: rarely  . Drug use: Yes    Types: Marijuana  . Sexual activity: Yes    Partners: Male    Birth control/protection: Condom  Lifestyle  . Physical activity    Days per week: Not on file    Minutes per session: Not on file  . Stress: Not on file  Relationships  . Social Herbalist on phone: Not on file    Gets together: Not on file    Attends religious service: Not on file    Active member of club or organization: Not on file    Attends meetings of clubs or organizations: Not on file    Relationship status: Not on file  . Intimate partner violence    Fear of current or ex partner: Not on file    Emotionally abused: Not on file    Physically abused: Not on file    Forced sexual activity: Not on file  Other Topics Concern  . Not  on file  Social History Narrative  . Not on file    Review of Systems  Constitutional: Negative.   HENT: Negative.   Eyes: Negative.   Respiratory: Negative.   Cardiovascular: Negative.   Gastrointestinal: Negative.   Genitourinary: Negative.   Musculoskeletal: Negative.   Skin: Negative.   Neurological: Negative.   Endo/Heme/Allergies: Negative.   Psychiatric/Behavioral: Negative.     PHYSICAL EXAMINATION:    BP 122/80 (BP Location: Right Arm, Patient Position: Sitting, Cuff Size: Normal)   Pulse 76   Temp (!) 97.4 F (36.3 C) (Skin)   Wt 129 lb 3.2 oz (58.6 kg)   LMP 07/13/2019 (Exact Date)   BMI 20.85 kg/m     General appearance: alert, cooperative and appears stated age  Pelvic: External genitalia:  Small skin tag on the upper right labia majora and upper, inner right thigh.              Urethra:  normal appearing urethra with no masses, tenderness or lesions              Bartholins and Skenes: normal                 Vagina: normal appearing vagina with an increase in watery, white vaginal discharge               Cervix: no lesions               Chaperone was present for exam.  Wet prep: ++ clue, no trich, no wbc KOH: no yeast PH: 5   ASSESSMENT Screening STD Bacterial vaginitis Contraception, using condoms.     PLAN Screening std's Treat for BV Continue to use condoms, prescribed Ella as morning after contraception   An After Visit Summary was printed and given to the patient.  ~20 minutes face to face time of which over 50% was spent in counseling.

## 2019-07-27 LAB — HEP, RPR, HIV PANEL
HIV Screen 4th Generation wRfx: NONREACTIVE
Hepatitis B Surface Ag: NEGATIVE
RPR Ser Ql: NONREACTIVE

## 2019-07-27 LAB — HEPATITIS C ANTIBODY: Hep C Virus Ab: <0.1 s/co ratio (ref 0.0–0.9)

## 2019-07-28 LAB — CHLAMYDIA/GONOCOCCUS/TRICHOMONAS, NAA
Chlamydia by NAA: NEGATIVE
Gonococcus by NAA: NEGATIVE
Trich vag by NAA: NEGATIVE

## 2019-07-31 ENCOUNTER — Telehealth: Payer: Self-pay | Admitting: Obstetrics and Gynecology

## 2019-07-31 NOTE — Telephone Encounter (Signed)
Patient returning call.

## 2019-08-29 ENCOUNTER — Telehealth: Payer: Self-pay | Admitting: Obstetrics and Gynecology

## 2019-08-29 NOTE — Telephone Encounter (Signed)
Spoke with patient. Patient was tx with flagyl for BV on 07/26/19. Completed medication, vaginal d/c returned approximately 2 days after. Reports white vaginal d/c with odpr, not as heavy as before. Denies vaginal itching, bleeding, pain or irregular bleeding. Advised patient OV needed for further evaluation, Covid19 prescreen negative, precautions reviewed. Patient request early morning appt this week, any available provider. Offered OV on 12/2 at 4:30pm with Dr. Talbert Nan, patient declined due to her work schedule. OV scheduled for 12/2 at 9:30am with Sara Hutchinson, CNM.   Routing to provider for final review. Patient is agreeable to disposition. Will close encounter.  Cc: Sara Hutchinson, CNM

## 2019-08-29 NOTE — Telephone Encounter (Signed)
Patient last seen 07/26/19. Treated for BV. States after finishing antibiotic, she is still feeling symptoms. Would like to discuss what she should do next and whether she needs to be seen again.

## 2019-08-30 ENCOUNTER — Encounter: Payer: Self-pay | Admitting: Certified Nurse Midwife

## 2019-08-30 ENCOUNTER — Ambulatory Visit: Payer: Medicaid Other | Admitting: Certified Nurse Midwife

## 2019-08-30 ENCOUNTER — Other Ambulatory Visit: Payer: Self-pay

## 2019-08-30 VITALS — BP 100/60 | HR 72 | Temp 97.2°F | Resp 16 | Wt 126.0 lb

## 2019-08-30 DIAGNOSIS — Z113 Encounter for screening for infections with a predominantly sexual mode of transmission: Secondary | ICD-10-CM

## 2019-08-30 DIAGNOSIS — N898 Other specified noninflammatory disorders of vagina: Secondary | ICD-10-CM | POA: Diagnosis not present

## 2019-08-30 NOTE — Progress Notes (Signed)
23 y.o. Single African American female G0P0000 here with complaint of cloudy white odorous discharge, denies itching or burning with current discharge. Onset of symptoms 10 days ago. Denies new personal products. She was treated with Flagyl for BV on and felt it had cleared until this noted. Has had a partner change and unprotected sex. Would like to have STD screening. Urinary symptoms none noted . Contraception is condoms, mostly consistent. Would like information regarding other options. No other health issues today.  Review of Systems  Constitutional: Negative.   HENT: Negative.   Eyes: Negative.   Respiratory: Negative.   Cardiovascular: Negative.   Genitourinary: Negative for dysuria, frequency and urgency.       Vaginal discharge with odor  Musculoskeletal: Negative.   Skin: Negative for itching and rash.       ? Flea bites or dry skin  Neurological: Negative.   Endo/Heme/Allergies: Negative.   Psychiatric/Behavioral: Negative.     O:Healthy female WDWN Affect: normal, orientation x 3  Exam: Skin:Warm and dry, small resolving occasional papular skin bump noted on axilla area, also noted on left inside of leg appears to be healing boil, no exudate or redness. Abdomen:soft, non tender, no masses  Inguinal Lymph nodes: no enlargement or tenderness Pelvic exam: External genital: normal female BUS: negative Vagina: copious white slight odorous discharge noted.  Affirm taken Cervix: normal, non tender, no CMT, GC/Chlamydia screen taken Uterus: normal, non tender Adnexa:normal, non tender, no masses or fullness noted   A:Normal pelvic exam Contraception condoms most of the time Odorous vaginal discharge, recently treated BV Partner change with unprotected sexual activity STD screening Papular skin rash resolving? Flea bites( has cat)    P:Discussed findings of vaginal discharge and possible  Etiology of yeast or irritation with new partner.. Discussed Aveeno or baking soda  sitz bath for comfort.  Discussed importance of condom use for STD protection and pregnancy prevention. Discussed importance of consistent use. Labs: Affirm, Gc, Chlamydia, HIV, RPR, Hep C Discussed skin finding and encouraged to do epsom salt tub bath should help resolve. Warnings given. Patient to bathe cat. Given printed information of contraceptive choices to consider.  Rv prn

## 2019-08-30 NOTE — Patient Instructions (Signed)
Preventing Sexually Transmitted Infections, Adult Sexually transmitted infections (STIs) are diseases that are passed (transmitted) from person to person through bodily fluids exchanged during sex or sexual contact. Bodily fluids include saliva, semen, blood, vaginal mucus, and urine. You may have an increased risk for developing an STI if you have unprotected oral, vaginal, or anal sex. Some common STIs include:  Herpes.  Hepatitis B.  Chlamydia.  Gonorrhea.  Syphilis.  HPV (human papillomavirus).  HIV (human immunodeficiency virus), the virus that can cause AIDS (acquired immunodeficiency syndrome). How can I protect myself from sexually transmitted infections? The only way to completely prevent STIs is not to have sex of any kind (practice abstinence). This includes oral, vaginal, or anal sex. If you are sexually active, take these actions to lower your risk of getting an STI:  Have only one sex partner (be monogamous) or limit the number of sexual partners you have.  Stay up-to-date on immunizations. Certain vaccines can lower your risk of getting certain STIs, such as: ? Hepatitis A and B vaccines. You may have been vaccinated as a young child, but likely need a booster shot as a teen or young adult. ? HPV vaccine.  Use methods that prevent the exchange of body fluids between partners (barrier protection) every time you have sex. Barrier protection can be used during oral, vaginal, or anal sex. Commonly used barrier methods include: ? Female condom. ? Female condom. ? Dental dam.  Get tested regularly for STIs. Have your sexual partner get tested regularly as well.  Avoid mixing alcohol, drugs, and sex. Alcohol and drug use can affect your ability to make good decisions and can lead to risky sexual behaviors.  Ask your health care provider about taking pre-exposure prophylaxis (PrEP) to prevent HIV infection if you: ? Have a HIV-positive sexual partner. ? Have multiple sexual  partners or partners who do not know their HIV status, and do not regularly use a condom during sex. ? Use injection drugs and share needles. Birth control pills, injections, implants, and intrauterine devices (IUDs) do not protect against STIs. To prevent both STIs and pregnancy, always use a condom with another form of birth control. Some STIs, such as herpes, are spread through skin to skin contact. A condom does not protect you from getting such STIs. If you or your partner have herpes and there is an active flare with open sores, avoid all sexual contact. Why are these changes important? Taking steps to practice safe sex protects you and others. Many STIs can be cured. However, some STIs are not curable and will affect you for the rest of your life. STIs can be passed on to another person even if you do not have symptoms. What can happen if changes are not made? Certain STIs may:  Require you to take medicine for the rest of your life.  Affect your ability to have children (your fertility).  Increase your risk for developing another STI or certain serious health conditions, such as: ? Cervical cancer. ? Head and neck cancer. ? Pelvic inflammatory disease (PID) in women. ? Organ damage or damage to other parts of your body, if the infection spreads.  Be passed to a baby during childbirth. How are sexually transmitted infections treated? If you or your partner know or think that you may have an STI:  Talk with your health care provider about what can be done to treat it. Some STIs can be treated and cured with medicines.  For curable STIs, you and   your partner should avoid sex during treatment and for several days after treatment is complete.  You and your partner should both be treated at the same time, if there is any chance that your partner is infected as well. If you get treatment but your partner does not, your partner can re-infect you when you resume sexual contact.  Do not  have unprotected sex. Where to find more information Learn more about sexually transmitted diseases and infections from:  Centers for Disease Control and Prevention: ? More information about specific STIs: www.cdc.gov/std ? Find places to get sexual health counseling and treatment for free or for a low cost: gettested.cdc.gov  [place] Department of Health and Human Services: www.womenshealth.gov/publications/our-publications/fact-sheet/sexually-transmitted-infections.html Summary  The only way to completely prevent STIs is not to have sex (practice abstinence), including oral, vaginal, or anal sex.  STIs can spread through saliva, semen, blood, vaginal mucus, urine, or sexual contact.  If you do have sex, limit your number of sexual partners and use a barrier protection method every time you have sex.  If you develop an STI, get treated right away and ask your partner to be treated as well. Do not resume having sex until both of you have completed treatment for the STI. This information is not intended to replace advice given to you by your health care provider. Make sure you discuss any questions you have with your health care provider. Document Released: 09/10/2016 Document Revised: 02/18/2018 Document Reviewed: 09/10/2016 Elsevier Patient Education  2020 [employer]  

## 2019-08-31 LAB — GC/CHLAMYDIA PROBE AMP
Chlamydia trachomatis, NAA: NEGATIVE
Neisseria Gonorrhoeae by PCR: NEGATIVE

## 2019-08-31 LAB — VAGINITIS/VAGINOSIS, DNA PROBE
Candida Species: NEGATIVE
Gardnerella vaginalis: POSITIVE — AB
Trichomonas vaginosis: NEGATIVE

## 2019-08-31 LAB — RPR: RPR Ser Ql: NONREACTIVE

## 2019-08-31 LAB — HEPATITIS C ANTIBODY: Hep C Virus Ab: <0.1 s/co ratio (ref 0.0–0.9)

## 2019-08-31 LAB — HIV ANTIBODY (ROUTINE TESTING W REFLEX): HIV Screen 4th Generation wRfx: NONREACTIVE

## 2019-09-01 ENCOUNTER — Other Ambulatory Visit: Payer: Self-pay

## 2019-09-01 MED ORDER — METRONIDAZOLE 500 MG PO TABS: 500.0000 mg | ORAL_TABLET | Freq: Two times a day (BID) | ORAL | 0 refills | Status: DC

## 2019-10-16 ENCOUNTER — Ambulatory Visit: Payer: Medicaid Other | Admitting: Certified Nurse Midwife

## 2019-10-16 ENCOUNTER — Encounter: Payer: Self-pay | Admitting: Certified Nurse Midwife

## 2019-10-16 ENCOUNTER — Other Ambulatory Visit: Payer: Self-pay

## 2019-10-16 VITALS — BP 120/70 | HR 68 | Temp 98.2°F | Resp 16 | Wt 129.0 lb

## 2019-10-16 DIAGNOSIS — R350 Frequency of micturition: Secondary | ICD-10-CM

## 2019-10-16 DIAGNOSIS — Z3009 Encounter for other general counseling and advice on contraception: Secondary | ICD-10-CM

## 2019-10-16 DIAGNOSIS — Z113 Encounter for screening for infections with a predominantly sexual mode of transmission: Secondary | ICD-10-CM

## 2019-10-16 LAB — POCT URINALYSIS DIPSTICK
Bilirubin, UA: NEGATIVE
Blood, UA: NEGATIVE
Glucose, UA: NEGATIVE
Ketones, UA: NEGATIVE
Leukocytes, UA: NEGATIVE
Nitrite, UA: NEGATIVE
Protein, UA: NEGATIVE
Urobilinogen, UA: NEGATIVE E.U./dL — AB
pH, UA: 5 (ref 5.0–8.0)

## 2019-10-16 NOTE — Progress Notes (Signed)
  24 y.o. Single African American female G0P0000 here with complaint of frequent urination, no odor to urine,  ? UTI, with onset on over the past few days.. Patient denies of urinary frequency, but has increase fluids/urgency/ and pain with urination. Patient denies fever, chills, nausea or back pain. No new personal products. Patient feels not related to sexual activity. Denies any vaginal symptoms of increase discharge or itching or burning. Contraception is condoms. Only sexual active one week ago, which may have been ovulation..Menopausal with vaginal dryness. Patient now drinking adequate water intake. LMP 09/29/2019. Consistent partner with STD screening negative.  Review of Systems  Constitutional: Negative.   HENT: Negative.   Eyes: Negative.   Respiratory: Negative.   Cardiovascular: Negative.   Gastrointestinal: Negative.   Genitourinary: Positive for frequency.       Pelvic pressure  Musculoskeletal: Negative.   Skin: Negative.   Neurological: Negative.   Endo/Heme/Allergies: Negative.   Psychiatric/Behavioral: Negative.     O: Healthy female WDWN Affect: Normal, orientation x 3 Skin : warm and dry CVAT: Negative bilateral Abdomen: soft no masses, tenderness, negative  for suprapubic tenderness  Pelvic exam: External genital area: normal, no lesions Bladder,Urethra non tender, Urethral meatus:non tender Vagina: normal appearing vaginal discharge, normal appearance  Cervix: normal, non tender Uterus:normal,non tender Adnexa: normal non tender, no fullness or masses Rectal area: normal appearance  poct urine-neg A: Normal pelvic exam STD concerns, STD screening requested Unprotected sexual activity Urinary frequency with recent increase in caffeine, no other symptoms Contraception needed  P: Reviewed findings of normal pelvic exam and no indication of UTI. Discussed decreasing caffeine to stop the frequency. Increase water and cranberry juice. Encouraged to limit soda,  tea, and coffee and be sure to increase water intake. Discussed STD prevention and pregnancy prevention with consistent condom use or contraception . Patient has booklet on options has not decided yet. Stressed condom use. If no menses do home UPT when period due and advise. Labs: Affirm, HIV,RPR,Hep C, GC, Chlamydia Questions addressed at length.   RV prn

## 2019-10-16 NOTE — Patient Instructions (Signed)

## 2019-10-17 ENCOUNTER — Telehealth: Payer: Self-pay | Admitting: Certified Nurse Midwife

## 2019-10-17 ENCOUNTER — Other Ambulatory Visit: Payer: Self-pay

## 2019-10-17 LAB — GC/CHLAMYDIA PROBE AMP
Chlamydia trachomatis, NAA: NEGATIVE
Neisseria Gonorrhoeae by PCR: NEGATIVE

## 2019-10-17 LAB — HEPATITIS C ANTIBODY: Hep C Virus Ab: <0.1 s/co ratio (ref 0.0–0.9)

## 2019-10-17 LAB — VAGINITIS/VAGINOSIS, DNA PROBE
Candida Species: NEGATIVE
Gardnerella vaginalis: POSITIVE — AB
Trichomonas vaginosis: NEGATIVE

## 2019-10-17 LAB — RPR: RPR Ser Ql: NONREACTIVE

## 2019-10-17 LAB — HIV ANTIBODY (ROUTINE TESTING W REFLEX): HIV Screen 4th Generation wRfx: NONREACTIVE

## 2019-10-17 MED ORDER — METRONIDAZOLE 500 MG PO TABS: 500.0000 mg | ORAL_TABLET | Freq: Two times a day (BID) | ORAL | 0 refills | Status: DC

## 2019-10-17 NOTE — Telephone Encounter (Signed)
I spoke to patient & sent in medication to [facility]. Okay to close encounter once viewed. Routing to Carmelina Dane, Charity fundraiser

## 2019-10-17 NOTE — Telephone Encounter (Signed)
Sara Hutchinson, [facility]  Phone Number: [phone]  I have a question about VAGINITIS/VAGINOSIS, DNA PROBE resulted on 10/17/19, 09:36.   I thought my discharge seemed normal. Nonetheless, may I get my prescription today and [facility] one on [facility]?

## 2019-10-17 NOTE — Telephone Encounter (Signed)
Routing to Leota Sauers, CNM to review 10/16/19 labs and advise.

## 2019-10-17 NOTE — Telephone Encounter (Signed)
Encounter closed

## 2019-12-11 ENCOUNTER — Encounter: Payer: Self-pay | Admitting: Certified Nurse Midwife

## 2019-12-13 ENCOUNTER — Encounter: Payer: Self-pay | Admitting: Certified Nurse Midwife

## 2020-02-02 ENCOUNTER — Other Ambulatory Visit: Payer: Self-pay

## 2020-02-02 NOTE — Progress Notes (Signed)
24 y.o. G0P0000 Single Black or African American Not Hispanic or Latino female here for annual exam.   Period Cycle (Days): 28 Period Duration (Days): 5 Period Pattern: Regular Menstrual Flow: Moderate Menstrual Control: Thin pad, Tampon Menstrual Control Change Freq (Hours): 4 Dysmenorrhea: (!) Moderate Dysmenorrhea Symptoms: Cramping  Cramps can be severe. Some help with Midol. Heating pad helps.  Sexually active, same partner since November. Sometimes uses condoms. Doesn't want to get pregnant.  She has a h/o BV.   H/O HSV, diagnosed in 2017. No outbreaks since she was discussed.   Patient's last menstrual period was 01/29/2020.          Sexually active: Yes.    The current method of family planning is none.    Exercising: Yes.    home workouts, yoga Smoker:  Yes social marijuana, no cigarettes.   Health Maintenance: Pap:  Never  History of abnormal Pap:  Never TDaP:  Unsure, she will check Gardasil: no, discussed.    reports that she has never smoked. She has never used smokeless tobacco. She reports current alcohol use. She reports previous drug use. Social ETOH. 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   Past Medical History:  Diagnosis Date  . Allergy   . Chlamydia contact, treated 09/2016  . 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 Urinary incontinence     History reviewed. No pertinent surgical history.  Current Outpatient Medications  Medication Sig Dispense Refill  . APPLE CIDER VINEGAR PO Take by mouth.    . CRANBERRY PO Take by mouth.    . Probiotic Product (PROBIOTIC PO) Take by mouth.    Marland Kitchen VITAMIN A PO Take by mouth.     No current facility-administered medications for this visit.    History reviewed. No pertinent family history.  Review of Systems  All other systems reviewed and are negative.   Exam:   BP 122/60   Pulse 92   Temp 98.4 F (36.9 C)   Ht 5' 5.75" (1.67 m)   Wt 128 lb (58.1 kg)   LMP 01/29/2020   SpO2 100%   BMI 20.82 kg/m    Weight change: @WEIGHTCHANGE @ Height:   Height: 5' 5.75" (167 cm)  Ht Readings from Last 3 Encounters:  02/05/20 5' 5.75" (1.67 m)  01/15/17 5\' 6"  (1.676 m)  10/09/16 5\' 6"  (1.676 m)    General appearance: alert, cooperative and appears stated age Head: Normocephalic, without obvious abnormality, atraumatic Neck: no adenopathy, supple, symmetrical, trachea midline and thyroid normal to inspection and palpation Lungs: clear to auscultation bilaterally Cardiovascular: regular rate and rhythm Breasts: normal appearance, no masses or tenderness Abdomen: soft, non-tender; non distended,  no masses,  no organomegaly Extremities: extremities normal, atraumatic, no cyanosis or edema Skin: Skin color, texture, turgor normal. No rashes or lesions Lymph nodes: Cervical, supraclavicular, and axillary nodes normal. No abnormal inguinal nodes palpated Neurologic: Grossly normal   Pelvic: External genitalia:  no lesions              Urethra:  normal appearing urethra with no masses, tenderness or lesions              Bartholins and Skenes: normal                 Vagina: normal appearing vagina with normal color and discharge, no lesions              Cervix: no lesions  Bimanual Exam:  Uterus:  normal size, contour, position, consistency, mobility, non-tender              Adnexa: no mass, fullness, tenderness               Rectovaginal: Confirms               Anus:  normal sphincter tone, no lesions  Gae Dry chaperoned for the exam.  A:  Well Woman with normal exam  Contraception discussed, recommended a trial of OCP's  Severe cramps  P:   Pap with GC/CT/CT  Trial of OCP's, reviewed risks, no contraindications.   F/U in 3 months  She will let me know if she wants ibuprofen called in  Discussed breast self exam  Discussed calcium and vit D intake  She will check on the gardasil vaccination, information given

## 2020-02-05 ENCOUNTER — Encounter: Payer: Self-pay | Admitting: Obstetrics and Gynecology

## 2020-02-05 ENCOUNTER — Ambulatory Visit (INDEPENDENT_AMBULATORY_CARE_PROVIDER_SITE_OTHER): Payer: Managed Care, Other (non HMO) | Admitting: Obstetrics and Gynecology

## 2020-02-05 ENCOUNTER — Other Ambulatory Visit: Payer: Self-pay

## 2020-02-05 ENCOUNTER — Other Ambulatory Visit (HOSPITAL_COMMUNITY)
Admission: RE | Admit: 2020-02-05 | Discharge: 2020-02-05 | Disposition: A | Discharge status: Home / Self Care | Payer: Managed Care, Other (non HMO) | Source: Ambulatory Visit | Attending: Obstetrics and Gynecology | Admitting: Obstetrics and Gynecology

## 2020-02-05 VITALS — BP 122/60 | HR 92 | Temp 98.4°F | Ht 65.75 in | Wt 128.0 lb

## 2020-02-05 DIAGNOSIS — Z124 Encounter for screening for malignant neoplasm of cervix: Secondary | ICD-10-CM | POA: Insufficient documentation

## 2020-02-05 DIAGNOSIS — Z0000 Encounter for general adult medical examination without abnormal findings: Secondary | ICD-10-CM | POA: Diagnosis not present

## 2020-02-05 DIAGNOSIS — Z113 Encounter for screening for infections with a predominantly sexual mode of transmission: Secondary | ICD-10-CM

## 2020-02-05 DIAGNOSIS — Z3009 Encounter for other general counseling and advice on contraception: Secondary | ICD-10-CM

## 2020-02-05 DIAGNOSIS — E559 Vitamin D deficiency, unspecified: Secondary | ICD-10-CM

## 2020-02-05 DIAGNOSIS — Z01419 Encounter for gynecological examination (general) (routine) without abnormal findings: Secondary | ICD-10-CM | POA: Diagnosis not present

## 2020-02-05 DIAGNOSIS — N946 Dysmenorrhea, unspecified: Secondary | ICD-10-CM

## 2020-02-05 MED ORDER — NORETHIN ACE-ETH ESTRAD-FE 1-20 MG-MCG PO TABS: 1.0000 | ORAL_TABLET | Freq: Every day | ORAL | 0 refills | Status: DC

## 2020-02-05 NOTE — Patient Instructions (Addendum)
EXERCISE AND DIET:  We recommended that you start or continue a regular exercise program for good health. Regular exercise means any activity that makes your heart beat faster and makes you sweat.  We recommend exercising at least 30 minutes per day at least 3 days a week, preferably 4 or 5.  We also recommend a diet low in fat and sugar.  Inactivity, poor dietary choices and obesity can cause diabetes, heart attack, stroke, and kidney damage, among others.    ALCOHOL AND SMOKING:  Women should limit their alcohol intake to no more than 7 drinks/beers/glasses of wine (combined, not each!) per week. Moderation of alcohol intake to this level decreases your risk of breast cancer and liver damage. And of course, no recreational drugs are part of a healthy lifestyle.  And absolutely no smoking or even second hand smoke. Most people know smoking can cause heart and lung diseases, but did you know it also contributes to weakening of your bones? Aging of your skin?  Yellowing of your teeth and nails?  CALCIUM AND VITAMIN D:  Adequate intake of calcium and Vitamin D are recommended.  The recommendations for exact amounts of these supplements seem to change often, but generally speaking 1,000 mg of calcium (between diet and supplement) and 800 units of Vitamin D per day seems prudent. Certain women may benefit from higher intake of Vitamin D.  If you are among these women, your doctor will have told you during your visit.    PAP SMEARS:  Pap smears, to check for cervical cancer or precancers,  have traditionally been done yearly, although recent scientific advances have shown that most women can have pap smears less often.  However, every woman still should have a physical exam from her gynecologist every year. It will include a breast check, inspection of the vulva and vagina to check for abnormal growths or skin changes, a visual exam of the cervix, and then an exam to evaluate the size and shape of the uterus and  ovaries.  And after 24 years of age, a rectal exam is indicated to check for rectal cancers. We will also provide age appropriate advice regarding health maintenance, like when you should have certain vaccines, screening for sexually transmitted diseases, bone density testing, colonoscopy, mammograms, etc.   MAMMOGRAMS:  All women over 40 years old should have a yearly mammogram. Many facilities now offer a "3D" mammogram, which may cost around $50 extra out of pocket. If possible,  we recommend you accept the option to have the 3D mammogram performed.  It both reduces the number of women who will be called back for extra views which then turn out to be normal, and it is better than the routine mammogram at detecting truly abnormal areas.    COLON CANCER SCREENING: Now recommend starting at age 45. At this time colonoscopy is not covered for routine screening until 50. There are take home tests that can be done between 45-49.   COLONOSCOPY:  Colonoscopy to screen for colon cancer is recommended for all women at age 50.  We know, you hate the idea of the prep.  We agree, BUT, having colon cancer and not knowing it is worse!!  Colon cancer so often starts as a polyp that can be seen and removed at colonscopy, which can quite literally save your life!  And if your first colonoscopy is normal and you have no family history of colon cancer, most women don't have to have it again for   10 years.  Once every ten years, you can do something that may end up saving your life, right?  We will be happy to help you get it scheduled when you are ready.  Be sure to check your insurance coverage so you understand how much it will cost.  It may be covered as a preventative service at no cost, but you should check your particular policy.      Breast Self-Awareness Breast self-awareness means being familiar with how your breasts look and feel. It involves checking your breasts regularly and reporting any changes to your  health care provider. Practicing breast self-awareness is important. A change in your breasts can be a sign of a serious medical problem. Being familiar with how your breasts look and feel allows you to find any problems early, when treatment is more likely to be successful. All women should practice breast self-awareness, including women who have had breast implants. How to do a breast self-exam One way to learn what is normal for your breasts and whether your breasts are changing is to do a breast self-exam. To do a breast self-exam: Look for Changes  1. Remove all the clothing above your waist. 2. Stand in front of a mirror in a room with good lighting. 3. Put your hands on your hips. 4. Push your hands firmly downward. 5. Compare your breasts in the mirror. Look for differences between them (asymmetry), such as: ? Differences in shape. ? Differences in size. ? Puckers, dips, and bumps in one breast and not the other. 6. Look at each breast for changes in your skin, such as: ? Redness. ? Scaly areas. 7. Look for changes in your nipples, such as: ? Discharge. ? Bleeding. ? Dimpling. ? Redness. ? A change in position. Feel for Changes Carefully feel your breasts for lumps and changes. It is best to do this while lying on your back on the floor and again while sitting or standing in the shower or tub with soapy water on your skin. Feel each breast in the following way:  Place the arm on the side of the breast you are examining above your head.  Feel your breast with the other hand.  Start in the nipple area and make  inch (2 cm) overlapping circles to feel your breast. Use the pads of your three middle fingers to do this. Apply light pressure, then medium pressure, then firm pressure. The light pressure will allow you to feel the tissue closest to the skin. The medium pressure will allow you to feel the tissue that is a little deeper. The firm pressure will allow you to feel the tissue  close to the ribs.  Continue the overlapping circles, moving downward over the breast until you feel your ribs below your breast.  Move one finger-width toward the center of the body. Continue to use the  inch (2 cm) overlapping circles to feel your breast as you move slowly up toward your collarbone.  Continue the up and down exam using all three pressures until you reach your armpit.  Write Down What You Find  Write down what is normal for each breast and any changes that you find. Keep a written record with breast changes or normal findings for each breast. By writing this information down, you do not need to depend only on memory for size, tenderness, or location. Write down where you are in your menstrual cycle, if you are still menstruating. If you are having trouble noticing differences   in your breasts, do not get discouraged. With time you will become more familiar with the variations in your breasts and more comfortable with the exam. How often should I examine my breasts? Examine your breasts every month. If you are breastfeeding, the best time to examine your breasts is after a feeding or after using a breast pump. If you menstruate, the best time to examine your breasts is 5-7 days after your period is over. During your period, your breasts are lumpier, and it may be more difficult to notice changes. When should I see my health care provider? See your health care provider if you notice:  A change in shape or size of your breasts or nipples.  A change in the skin of your breast or nipples, such as a reddened or scaly area.  Unusual discharge from your nipples.  A lump or thick area that was not there before.  Pain in your breasts.  Anything that concerns you.  Oral Contraception Information Oral contraceptive pills (OCPs) are medicines taken to prevent pregnancy. OCPs are taken by mouth, and they work by:  Preventing the ovaries from releasing eggs.  Thickening mucus in  the lower part of the uterus (cervix), which prevents sperm from entering the uterus.  Thinning the lining of the uterus (endometrium), which prevents a fertilized egg from attaching to the endometrium. OCPs are highly effective when taken exactly as prescribed. However, OCPs do not prevent STIs (sexually transmitted infections). Safe sex practices, such as using condoms while on an OCP, can help prevent STIs. Before starting OCPs Before you start taking OCPs, you may have a physical exam, blood test, and Pap test. However, you are not required to have a pelvic exam in order to be prescribed OCPs. Your health care provider will make sure you are a good candidate for oral contraception. OCPs are not a good option for certain women, including women who smoke and are older than 35 years, and women with a medical history of high blood pressure, deep vein thrombosis, pulmonary embolism, stroke, cardiovascular disease, or peripheral vascular disease. Discuss with your health care provider the possible side effects of the OCP you may be prescribed. When you start an OCP, be aware that it can take 2-3 months for your body to adjust to changes in hormone levels. Follow instructions from your health care provider about how to start taking your first cycle of OCPs. Depending on when you start the pill, you may need to use a backup form of birth control, such as condoms, during the first week. Make sure you know what steps to take if you ever forget to take the pill. Types of oral contraception  The most common types of birth control pills contain the hormones estrogen and progestin (synthetic progesterone) or progestin only. The combination pill This type of pill contains estrogen and progestin hormones. Combination pills often come in packs of 21, 28, or 91 pills. For each pack, the last 7 pills may not contain hormones, which means you may stop taking the pills for 7 days. Menstrual bleeding occurs during the  week that you do not take the pills or that you take the pills with no hormones in them. The minipill This type of pill contains the progestin hormone only. It comes in packs of 28 pills. All 28 pills contain the hormone. You take the pill every day. It is very important to take the pill at the same time each day. Advantages of oral contraceptive pills    Provides reliable and continuous contraception if taken as instructed.  May treat or decrease symptoms of: ? Menstrual period cramps. ? Irregular menstrual cycle or bleeding. ? Heavy menstrual flow. ? Abnormal uterine bleeding. ? Acne, depending on the type of pill. ? Polycystic ovarian syndrome. ? Endometriosis. ? Iron deficiency anemia. ? Premenstrual symptoms, including premenstrual dysphoric disorder.  May reduce the risk of endometrial and ovarian cancer.  Can be used as emergency contraception.  Prevents mislocated (ectopic) pregnancies and infections of the fallopian tubes. Things that can make oral contraceptive pills less effective OCPs can be less effective if:  You forget to take the pill at the same time every day. This is especially important when taking the minipill.  You have a stomach or intestinal disease that reduces your body's ability to absorb the pill.  You take OCPs with other medicines that make OCPs less effective, such as antibiotics, certain HIV medicines, and some seizure medicines.  You take expired OCPs.  You forget to restart the pill on day 7, if using the packs of 21 pills. Risks associated with oral contraceptive pills Oral contraceptive pills can sometimes cause side effects, such as:  Headache.  Depression.  Trouble sleeping.  Nausea and vomiting.  Breast tenderness.  Irregular bleeding or spotting during the first several months.  Bloating or fluid retention.  Increase in blood pressure. Combination pills are also associated with a small increase in the risk of:  Blood  clots.  Heart attack.  Stroke. Summary  Oral contraceptive pills are medicines taken by mouth to prevent pregnancy. They are highly effective when taken exactly as prescribed.  The most common types of birth control pills contain the hormones estrogen and progestin (synthetic progesterone) or progestin only.  Before you start taking the pill, you may have a physical exam, blood test, and Pap test. Your health care provider will make sure you are a good candidate for oral contraception.  The combination pill may come in a 21-day pack, a 28-day pack, or a 91-day pack. The minipill contains the progesterone hormone only and comes in packs of 28 pills.  Oral contraceptive pills can sometimes cause side effects, such as headache, nausea, breast tenderness, or irregular bleeding. This information is not intended to replace advice given to you by your health care provider. Make sure you discuss any questions you have with your health care provider. Document Revised: 08/27/2017 Document Reviewed: 12/08/2016 Elsevier Patient Education  2020 [employer]  

## 2020-02-06 LAB — COMPREHENSIVE METABOLIC PANEL
ALT: 17 IU/L (ref 0–32)
AST: 23 IU/L (ref 0–40)
Albumin/Globulin Ratio: 1.9 (ref 1.2–2.2)
Albumin: 4.8 g/dL (ref 3.9–5.0)
Alkaline Phosphatase: 67 IU/L (ref 39–117)
BUN/Creatinine Ratio: 9 (ref 9–23)
BUN: 7 mg/dL (ref 6–20)
Bilirubin Total: 0.3 mg/dL (ref 0.0–1.2)
CO2: 24 mmol/L (ref 20–29)
Calcium: 9.6 mg/dL (ref 8.7–10.2)
Chloride: 103 mmol/L (ref 96–106)
Creatinine, Ser: 0.79 mg/dL (ref 0.57–1.00)
GFR calc Af Amer: 122 mL/min/{1.73_m2} (ref >59)
GFR calc non Af Amer: 106 mL/min/{1.73_m2} (ref >59)
Globulin, Total: 2.5 g/dL (ref 1.5–4.5)
Glucose: 96 mg/dL (ref 65–99)
Potassium: 3.8 mmol/L (ref 3.5–5.2)
Sodium: 141 mmol/L (ref 134–144)
Total Protein: 7.3 g/dL (ref 6.0–8.5)

## 2020-02-06 LAB — HEP, RPR, HIV PANEL
HIV Screen 4th Generation wRfx: NONREACTIVE
Hepatitis B Surface Ag: NEGATIVE
RPR Ser Ql: NONREACTIVE

## 2020-02-06 LAB — CBC
Hematocrit: 36.2 % (ref 34.0–46.6)
Hemoglobin: 11.2 g/dL (ref 11.1–15.9)
MCH: 26.2 pg — ABNORMAL LOW (ref 26.6–33.0)
MCHC: 30.9 g/dL — ABNORMAL LOW (ref 31.5–35.7)
MCV: 85 fL (ref 79–97)
Platelets: 430 10*3/uL (ref 150–450)
RBC: 4.27 x10E6/uL (ref 3.77–5.28)
RDW: 12.8 % (ref 11.7–15.4)
WBC: 4.9 10*3/uL (ref 3.4–10.8)

## 2020-02-06 LAB — LIPID PANEL
Chol/HDL Ratio: 1.7 ratio (ref 0.0–4.4)
Cholesterol, Total: 125 mg/dL (ref 100–199)
HDL: 73 mg/dL (ref >39)
LDL Chol Calc (NIH): 42 mg/dL (ref 0–99)
Triglycerides: 41 mg/dL (ref 0–149)
VLDL Cholesterol Cal: 10 mg/dL (ref 5–40)

## 2020-02-06 LAB — CYTOLOGY - PAP
Chlamydia: NEGATIVE
Comment: NEGATIVE
Comment: NEGATIVE
Comment: NORMAL
Diagnosis: NEGATIVE
Neisseria Gonorrhea: NEGATIVE
Trichomonas: NEGATIVE

## 2020-02-06 LAB — VITAMIN D 25 HYDROXY (VIT D DEFICIENCY, FRACTURES): Vit D, 25-Hydroxy: 34.3 ng/mL (ref 30.0–100.0)

## 2020-02-06 LAB — HEPATITIS C ANTIBODY: Hep C Virus Ab: <0.1 s/co ratio (ref 0.0–0.9)

## 2020-04-21 ENCOUNTER — Other Ambulatory Visit: Payer: Self-pay | Admitting: Obstetrics and Gynecology

## 2020-04-22 NOTE — Telephone Encounter (Signed)
She is supposed to come in for a f/u visit. One month refill was sent. Please schedule the appointment for her.

## 2020-04-22 NOTE — Telephone Encounter (Signed)
Medication refill request: Junel FE 1mg  - Last AEX:  02/05/20 Next AEX: Not yet scheduled Last MMG (if hormonal medication request): NA Refill authorized: #84/0

## 2020-05-06 ENCOUNTER — Other Ambulatory Visit: Payer: Self-pay | Admitting: Obstetrics and Gynecology

## 2020-05-06 DIAGNOSIS — Z3009 Encounter for other general counseling and advice on contraception: Secondary | ICD-10-CM

## 2020-05-06 NOTE — Telephone Encounter (Signed)
Medication refill request: Junel FE 1mg /61mcg  Last AEX:  02/05/20 Next AEX: not yet scheduled Last MMG (if hormonal medication request): NA Refill authorized: 84/1

## 2020-05-06 NOTE — Telephone Encounter (Signed)
Spoke with pt. Pt states didn't make 3 month appt for follow up, but states is doing well and having regular cycles, LMP 7/17 with mild cramps, but didn't didn't take anything for it. Pt states would like to continue. Pt states having no BTB, taking same time every day. Pt scheduled next AEX for 02/12/21 at 9 am. Pt aware of sent Rx with refills until AEX. Encounter closed.

## 2020-05-06 NOTE — Telephone Encounter (Signed)
The patient was supposed to come in for a pill check to see how she is doing on OCP's. Please call and check on her. If she is doing great and wants to continue, please send in refills until her next annual exam.

## 2020-06-12 ENCOUNTER — Encounter: Payer: Self-pay | Admitting: Obstetrics and Gynecology

## 2020-07-08 ENCOUNTER — Telehealth: Payer: Self-pay

## 2020-07-08 NOTE — Telephone Encounter (Signed)
Appointment Request From: Uvaldo Rising    With Provider: Romualdo Bolk, MD [facility]]    Preferred Date Range: 07/10/2020 - 07/12/2020    Preferred Times: Wednesday Morning, Friday Morning    Reason for visit: Request an Appointment    Comments:  STD screening/general check/birth control follow-up.

## 2020-07-08 NOTE — Telephone Encounter (Signed)
Spoke with patient. Started Junel Fe after 02/05/20 AEX, needs to schedule 3 mo OCP f/u. Reports OCP is working well, menses are lighter, shorter and less cramps. Also requesting to STD testing. Same partner, uses condoms most of the time, noticed change in vaginal odor after intercourse last week. Denies any other symptoms. OV scheduled for 10/14 at 10am w/ Dr. Oscar La. Patient declined earlier appt offered. Patient is agreeable to date and time.   Encounter closed.

## 2020-07-11 ENCOUNTER — Encounter: Payer: Self-pay | Admitting: Obstetrics and Gynecology

## 2020-07-11 ENCOUNTER — Ambulatory Visit (INDEPENDENT_AMBULATORY_CARE_PROVIDER_SITE_OTHER): Payer: Managed Care, Other (non HMO) | Admitting: Obstetrics and Gynecology

## 2020-07-11 ENCOUNTER — Other Ambulatory Visit: Payer: Self-pay

## 2020-07-11 VITALS — BP 128/62 | HR 103 | Ht 67.0 in | Wt 134.0 lb

## 2020-07-11 DIAGNOSIS — N898 Other specified noninflammatory disorders of vagina: Secondary | ICD-10-CM

## 2020-07-11 DIAGNOSIS — Z3009 Encounter for other general counseling and advice on contraception: Secondary | ICD-10-CM | POA: Diagnosis not present

## 2020-07-11 DIAGNOSIS — Z113 Encounter for screening for infections with a predominantly sexual mode of transmission: Secondary | ICD-10-CM | POA: Diagnosis not present

## 2020-07-11 MED ORDER — NORETHIN ACE-ETH ESTRAD-FE 1-20 MG-MCG PO TABS: 1.0000 | ORAL_TABLET | Freq: Every day | ORAL | 1 refills | Status: DC

## 2020-07-11 MED ORDER — METRONIDAZOLE 500 MG PO TABS
500.0000 mg | ORAL_TABLET | Freq: Two times a day (BID) | ORAL | 0 refills | Status: DC
Start: 2020-07-11 — End: 2020-10-31

## 2020-07-11 NOTE — Patient Instructions (Signed)
Vaginitis Vaginitis is a condition in which the vaginal tissue swells and becomes red (inflamed). This condition is most often caused by a change in the normal balance of bacteria and yeast that live in the vagina. This change causes an overgrowth of certain bacteria or yeast, which causes the inflammation. There are different types of vaginitis, but the most common types are:  Bacterial vaginosis.  Yeast infection (candidiasis).  Trichomoniasis vaginitis. This is a sexually transmitted disease (STD).  Viral vaginitis.  Atrophic vaginitis.  Allergic vaginitis. What are the causes? The cause of this condition depends on the type of vaginitis. It can be caused by:  Bacteria (bacterial vaginosis).  Yeast, which is a fungus (yeast infection).  A parasite (trichomoniasis vaginitis).  A virus (viral vaginitis).  Low hormone levels (atrophic vaginitis). Low hormone levels can occur during pregnancy, breastfeeding, or after menopause.  Irritants, such as bubble baths, scented tampons, and feminine sprays (allergic vaginitis). Other factors can change the normal balance of the yeast and bacteria that live in the vagina. These include:  Antibiotic medicines.  Poor hygiene.  Diaphragms, vaginal sponges, spermicides, birth control pills, and intrauterine devices (IUD).  Sex.  Infection.  Uncontrolled diabetes.  A weakened defense (immune) system. What increases the risk? This condition is more likely to develop in women who:  Smoke.  Use vaginal douches, scented tampons, or scented sanitary pads.  Wear tight-fitting pants.  Wear thong underwear.  Use oral birth control pills or an IUD.  Have sex without a condom.  Have multiple sex partners.  Have an STD.  Frequently use the spermicide nonoxynol-9.  Eat lots of foods high in sugar.  Have uncontrolled diabetes.  Have low estrogen levels.  Have a weakened immune system from an immune disorder or medical  treatment.  Are pregnant or breastfeeding. What are the signs or symptoms? Symptoms vary depending on the cause of the vaginitis. Common symptoms include:  Abnormal vaginal discharge. ? The discharge is white, gray, or yellow with bacterial vaginosis. ? The discharge is thick, white, and cheesy with a yeast infection. ? The discharge is frothy and yellow or greenish with trichomoniasis.  A bad vaginal smell. The smell is fishy with bacterial vaginosis.  Vaginal itching, pain, or swelling.  Sex that is painful.  Pain or burning when urinating. Sometimes there are no symptoms. How is this diagnosed? This condition is diagnosed based on your symptoms and medical history. A physical exam, including a pelvic exam, will also be done. You may also have other tests, including:  Tests to determine the pH level (acidity or alkalinity) of your vagina.  A whiff test, to assess the odor that results when a sample of your vaginal discharge is mixed with a potassium hydroxide solution.  Tests of vaginal fluid. A sample will be examined under a microscope. How is this treated? Treatment varies depending on the type of vaginitis you have. Your treatment may include:  Antibiotic creams or pills to treat bacterial vaginosis and trichomoniasis.  Antifungal medicines, such as vaginal creams or suppositories, to treat a yeast infection.  Medicine to ease discomfort if you have viral vaginitis. Your sexual partner should also be treated.  Estrogen delivered in a cream, pill, suppository, or vaginal ring to treat atrophic vaginitis. If vaginal dryness occurs, lubricants and moisturizing creams may help. You may need to avoid scented soaps, sprays, or douches.  Stopping use of a product that is causing allergic vaginitis. Then using a vaginal cream to treat the symptoms. Follow   these instructions at home: Lifestyle  Keep your genital area clean and dry. Avoid soap, and only rinse the area with  water.  Do not douche or use tampons until your health care provider says it is okay to do so. Use sanitary pads, if needed.  Do not have sex until your health care provider approves. When you can return to sex, practice safe sex and use condoms.  Wipe from front to back. This avoids the spread of bacteria from the rectum to the vagina. General instructions  Take over-the-counter and prescription medicines only as told by your health care provider.  If you were prescribed an antibiotic medicine, take or use it as told by your health care provider. Do not stop taking or using the antibiotic even if you start to feel better.  Keep all follow-up visits as told by your health care provider. This is important. How is this prevented?  Use mild, non-scented products. Do not use things that can irritate the vagina, such as fabric softeners. Avoid the following products if they are scented: ? Feminine sprays. ? Detergents. ? Tampons. ? Feminine hygiene products. ? Soaps or bubble baths.  Let air reach your genital area. ? Wear cotton underwear to reduce moisture buildup. ? Avoid wearing underwear while you sleep. ? Avoid wearing tight pants and underwear or nylons without a cotton panel. ? Avoid wearing thong underwear.  Take off any wet clothing, such as bathing suits, as soon as possible.  Practice safe sex and use condoms. Contact a health care provider if:  You have abdominal pain.  You have a fever.  You have symptoms that last for more than 2-3 days. Get help right away if:  You have a fever and your symptoms suddenly get worse. Summary  Vaginitis is a condition in which the vaginal tissue becomes inflamed.This condition is most often caused by a change in the normal balance of bacteria and yeast that live in the vagina.  Treatment varies depending on the type of vaginitis you have.  Do not douche, use tampons , or have sex until your health care provider approves. When  you can return to sex, practice safe sex and use condoms. This information is not intended to replace advice given to you by your health care provider. Make sure you discuss any questions you have with your health care provider. Document Revised: 08/27/2017 Document Reviewed: 10/20/2016 Elsevier Patient Education  2020 Elsevier Inc.  

## 2020-07-11 NOTE — Progress Notes (Signed)
GYNECOLOGY  VISIT   HPI: 24 y.o.   Single Black or African American Not Hispanic or Latino  female   G0P0000 with Patient's last menstrual period was 07/06/2020.   here for urine smell thinks she may have BV. She had a slight change in discharge. Her blood smells different. Not a fishy odor.  She noticed an odor to her urine prior to her cycle. Not aware of it now. No urinary frequency, urgency to void, no dysuria. No itching, burning or irritation.   She was also started on OCP's in 5/21 for contraception and control of severe dysmenorrhea. Cycles are monthly x ~7 days. Saturating a pad in 5-6 hours. Cramps are a lot better. Not needing medication for her cramps.   GYNECOLOGIC HISTORY: Patient's last menstrual period was 07/06/2020. Contraception:OCP Menopausal hormone therapy: none         OB History    Gravida  0   Para  0   Term  0   Preterm  0   AB  0   Living  0     SAB  0   TAB  0   Ectopic  0   Multiple  0   Live Births                 There are no problems to display for this patient.   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 No pertinent surgical history.  Current Outpatient Medications  Medication Sig Dispense Refill   CRANBERRY PO Take by mouth.     ELDERBERRY PO Take by mouth.     JUNEL FE 1/20 1-20 MG-MCG tablet TAKE 1 TABLET BY MOUTH EVERY DAY 84 tablet 3   Multiple Vitamins-Minerals (HAIR SKIN AND NAILS FORMULA PO) Take by mouth.     VITAMIN A PO Take by mouth.     No current facility-administered medications for this visit.     ALLERGIES: Patient has no known allergies.  History reviewed. No pertinent family history.  Social History   Socioeconomic History   Marital status: Single    Spouse name: Not on file   Number of children: Not on file   Years of education: Not on file    Highest education level: Not on file  Occupational History   Not on file  Tobacco Use   Smoking status: Never Smoker   Smokeless tobacco: Never Used  Vaping Use   Vaping Use: Never used  Substance and Sexual Activity   Alcohol use: Yes    Alcohol/week: 0.0 - 3.0 standard drinks   Drug use: Not Currently   Sexual activity: Yes    Partners: Male    Birth control/protection: Condom    Comment: condoms occ  Other Topics Concern   Not on file  Social History Narrative   Not on file   Social Determinants of Health   Financial Resource Strain:    Difficulty of Paying Living Expenses: Not on file  Food Insecurity:    Worried About Programme researcher, broadcasting/film/video in the Last Year: Not on file   [employer] of Food in the Last Year: Not on file  Transportation Needs:    Lack of Transportation (Medical): Not on file   Lack of Transportation (Non-Medical): Not on file  Physical Activity:    Days of Exercise per Week:  Not on file   Minutes of Exercise per Session: Not on file  Stress:    Feeling of Stress : Not on file  Social Connections:    Frequency of Communication with Friends and Family: Not on file   Frequency of Social Gatherings with Friends and Family: Not on file   Attends Religious Services: Not on file   Active Member of Clubs or Organizations: Not on file   Attends Banker Meetings: Not on file   Marital Status: Not on file  Intimate Partner Violence:    Fear of Current or Ex-Partner: Not on file   Emotionally Abused: Not on file   Physically Abused: Not on file   Sexually Abused: Not on file    ROS: see hpi, otherwise negative  PHYSICAL EXAMINATION:    BP 128/62    Pulse (!) 103    Ht 5\' 7"  (1.702 m)    Wt 134 lb (60.8 kg)    LMP 07/06/2020    SpO2 99%    BMI 20.99 kg/m     General appearance: alert, cooperative and appears stated age   Pelvic: External genitalia:  no lesions              Urethra:  normal appearing urethra with no  masses, tenderness or lesions              Bartholins and Skenes: normal                 Vagina: normal appearing vagina with a slight increase in brown vaginal d/c              Cervix: no lesions                Chaperone was present for exam.  Wet prep: ++ clue, no trich, rare wbc KOH: no yeast PH: 5   ASSESSMENT Bacterial vaginitis Cramps, markedly improved on OCP's Screening STD's    PLAN Flagyl for BV (no ETOH) Screening STD Continue OCP's   An After Visit Summary was printed and given to the patient.

## 2020-07-12 LAB — CHLAMYDIA/GONOCOCCUS/TRICHOMONAS, NAA
Chlamydia by NAA: NEGATIVE
Gonococcus by NAA: NEGATIVE
Trich vag by NAA: NEGATIVE

## 2020-07-12 LAB — RPR: RPR Ser Ql: NONREACTIVE

## 2020-07-12 LAB — HIV ANTIBODY (ROUTINE TESTING W REFLEX): HIV Screen 4th Generation wRfx: NONREACTIVE

## 2020-08-05 ENCOUNTER — Encounter: Payer: Self-pay | Admitting: Obstetrics and Gynecology

## 2020-10-30 ENCOUNTER — Encounter: Payer: Self-pay | Admitting: Obstetrics and Gynecology

## 2020-10-31 ENCOUNTER — Ambulatory Visit (INDEPENDENT_AMBULATORY_CARE_PROVIDER_SITE_OTHER): Payer: Managed Care, Other (non HMO) | Admitting: Obstetrics and Gynecology

## 2020-10-31 ENCOUNTER — Other Ambulatory Visit: Payer: Self-pay

## 2020-10-31 ENCOUNTER — Encounter: Payer: Self-pay | Admitting: Obstetrics and Gynecology

## 2020-10-31 VITALS — BP 118/72 | HR 76 | Ht 67.0 in | Wt 141.0 lb

## 2020-10-31 DIAGNOSIS — N760 Acute vaginitis: Secondary | ICD-10-CM

## 2020-10-31 DIAGNOSIS — B9689 Other specified bacterial agents as the cause of diseases classified elsewhere: Secondary | ICD-10-CM

## 2020-10-31 DIAGNOSIS — N898 Other specified noninflammatory disorders of vagina: Secondary | ICD-10-CM | POA: Diagnosis not present

## 2020-10-31 LAB — WET PREP FOR TRICH, YEAST, CLUE

## 2020-10-31 MED ORDER — METRONIDAZOLE 0.75 % VA GEL: 1.0000 | Freq: Every day | VAGINAL | 0 refills | Status: DC

## 2020-10-31 NOTE — Progress Notes (Signed)
GYNECOLOGY  VISIT   HPI: 25 y.o.   Single Black or African American Not Hispanic or Latino  female   G0P0000 with Patient's last menstrual period was 10/21/2020.   here for an infection. Patient c/o having a "thicker white discharge" a couple of weeks ago after having intercourse. Patient then started her period last week and now does not have any symptoms but would like a check to ensure there's no infection.  Same partner, he just moved to [facility]. She is considering going there (works from home). She feels she has BV. She hasn't noticed an odor, just an increase in vaginal d/c. Her cycle started on 10/21/20, still spotting so she isn't sure if she is still having d/c.   GYNECOLOGIC HISTORY: Patient's last menstrual period was 10/21/2020. Contraception:OCP Menopausal hormone therapy: n/a        OB History    Gravida  0   Para  0   Term  0   Preterm  0   AB  0   Living  0     SAB  0   IAB  0   Ectopic  0   Multiple  0   Live Births                 There are no problems to display for this patient.   Past Medical History:  Diagnosis Date  . Allergy   . Chlamydia contact, treated 09/2016  . 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 Urinary incontinence     History reviewed. No pertinent surgical history.  Current Outpatient Medications  Medication Sig Dispense Refill  . CRANBERRY PO Take by mouth.    . ELDERBERRY PO Take by mouth.    . Multiple Vitamins-Minerals (HAIR SKIN AND NAILS FORMULA PO) Take by mouth.    . norethindrone-ethinyl estradiol (JUNEL FE 1/20) 1-20 MG-MCG tablet Take 1 tablet by mouth daily. 84 tablet 1  . VITAMIN A PO Take by mouth.     No current facility-administered medications for this visit.     ALLERGIES: Patient has no known allergies.  History reviewed. No pertinent family history.  Social History   Socioeconomic History  . Marital status: Single    Spouse name: Not on file  . Number of children: Not on file   . Years of education: Not on file  . Highest education level: Not on file  Occupational History  . Not on file  Tobacco Use  . Smoking status: Never Smoker  . Smokeless tobacco: Never Used  Vaping Use  . Vaping Use: Never used  Substance and Sexual Activity  . Alcohol use: Yes    Alcohol/week: 0.0 - 3.0 standard drinks  . Drug use: Not Currently  . Sexual activity: Yes    Partners: Male    Birth control/protection: Condom    Comment: condoms occ  Other Topics Concern  . Not on file  Social History Narrative  . Not on file   Social Determinants of Health   Financial Resource Strain: Not on file  Food Insecurity: Not on file  Transportation Needs: Not on file  Physical Activity: Not on file  Stress: Not on file  Social Connections: Not on file  Intimate Partner Violence: Not on file    Review of Systems  Constitutional: Negative.   HENT: Negative.   Eyes: Negative.   Respiratory: Negative.   Cardiovascular: Negative.   Gastrointestinal: Negative.   Genitourinary: Negative.   Musculoskeletal: Negative.  Skin: Negative.   Neurological: Negative.   Endo/Heme/Allergies: Negative.   Psychiatric/Behavioral: Negative.     PHYSICAL EXAMINATION:    BP 118/72 (BP Location: Left Arm, Patient Position: Sitting, Cuff Size: Normal)   Pulse 76   Ht 5\' 7"  (1.702 m)   Wt 141 lb (64 kg)   LMP 10/21/2020   BMI 22.08 kg/m     General appearance: alert, cooperative and appears stated age   Pelvic: External genitalia:  no lesions              Urethra:  normal appearing urethra with no masses, tenderness or lesions              Bartholins and Skenes: normal                 Vagina: normal appearing vagina, slight increase in watery, brown vaginal discharge              Cervix: no lesions               Wet prep: + clue cells.   1. Vaginal discharge  - WET PREP FOR TRICH, YEAST, CLUE, + for clue  2. Bacterial vaginitis We discussed that treatment is only needed if  symptomatic.  - metroNIDAZOLE (METROGEL) 0.75 % vaginal gel; Place 1 Applicatorful vaginally at bedtime. Use for 5 days  Dispense: 70 g; Refill: 0

## 2020-10-31 NOTE — Patient Instructions (Signed)
Bacterial Vaginosis  Bacterial vaginosis is an infection that occurs when the normal balance of bacteria in the vagina changes. This change is caused by an overgrowth of certain bacteria in the vagina. Bacterial vaginosis is the most common vaginal infection among females aged 25 to 44 years. This condition increases the risk of sexually transmitted infections (STIs). Treatment can help reduce this risk. Treatment is very important for pregnant women because this condition can cause babies to be born early (prematurely) or at a low birth weight. What are the causes? This condition is caused by an increase in harmful bacteria that are normally present in small amounts in the vagina. However, the exact reason this condition develops is not known. You cannot get bacterial vaginosis from toilet seats, bedding, swimming pools, or contact with objects around you. What increases the risk? The following factors may make you more likely to develop this condition:  Having a new sexual partner or multiple sexual partners, or having unprotected sex.  Douching.  Having an intrauterine device (IUD).  Smoking.  Abusing drugs and alcohol. This may lead to riskier sexual behavior.  Taking certain antibiotic medicines.  Being pregnant. What are the signs or symptoms? Some women with this condition have no symptoms. Symptoms may include:  Gray or white vaginal discharge. The discharge can be watery or foamy.  A fish-like odor with discharge, especially after sex or during menstruation.  Itching in and around the vagina.  Burning or pain with urination. How is this diagnosed? This condition is diagnosed based on:  Your medical history.  A physical exam of the vagina.  Checking a sample of vaginal fluid for harmful bacteria or abnormal cells. How is this treated? This condition is treated with antibiotic medicines. These may be given as a pill, a vaginal cream, or a medicine that is put into the  vagina (suppository). If the condition comes back after treatment, a second round of antibiotics may be needed. Follow these instructions at home: Medicines  Take or apply over-the-counter and prescription medicines only as told by your health care provider.  Take or apply your antibiotic medicine as told by your health care provider. Do not stop using the antibiotic even if you start to feel better. General instructions  If you have a female sexual partner, tell her that you have a vaginal infection. She should follow up with her health care provider. If you have a female sexual partner, he does not need treatment.  Avoid sexual activity until you finish treatment.  Drink enough fluid to keep your urine pale yellow.  Keep the area around your vagina and rectum clean. ? Wash the area daily with warm water. ? Wipe yourself from front to back after using the toilet.  If you are breastfeeding, talk to your health care provider about continuing breastfeeding during treatment.  Keep all follow-up visits. This is important. How is this prevented? Self-care  Do not douche.  Wash the outside of your vagina with warm water only.  Wear cotton or cotton-lined underwear.  Avoid wearing tight pants and pantyhose, especially during the summer. Safe sex  Use protection when having sex. This includes: ? Using condoms. ? Using dental dams. This is a thin layer of a material made of latex or polyurethane that protects the mouth during oral sex.  Limit the number of sexual partners. To help prevent bacterial vaginosis, it is best to have sex with just one partner (monogamous relationship).  Make sure you and your sexual partner   are tested for STIs. Drugs and alcohol  Do not use any products that contain nicotine or tobacco. These products include cigarettes, chewing tobacco, and vaping devices, such as e-cigarettes. If you need help quitting, ask your health care provider.  Do not use  drugs.  Do not drink alcohol if: ? Your health care provider tells you not to do this. ? You are pregnant, may be pregnant, or are planning to become pregnant.  If you drink alcohol: ? Limit how much you have to 0-1 drink a day. ? Be aware of how much alcohol is in your drink. In the [place] one drink equals one 12 oz bottle of beer (355 mL), one 5 oz glass of wine (148 mL), or one 1 oz glass of hard liquor (44 mL). Where to find more information  Centers for Disease Control and Prevention: www.cdc.gov  American Sexual Health Association (ASHA): www.ashastd.org  [place] Department of Health and Human Services, Office on Women's Health: www.womenshealth.gov Contact a health care provider if:  Your symptoms do not improve, even after treatment.  You have more discharge or pain when urinating.  You have a fever or chills.  You have pain in your abdomen or pelvis.  You have pain during sex.  You have vaginal bleeding between menstrual periods. Summary  Bacterial vaginosis is a vaginal infection that occurs when the normal balance of bacteria in the vagina changes. It results from an overgrowth of certain bacteria.  This condition increases the risk of sexually transmitted infections (STIs). Getting treated can help reduce this risk.  Treatment is very important for pregnant women because this condition can cause babies to be born early (prematurely) or at low birth weight.  This condition is treated with antibiotic medicines. These may be given as a pill, a vaginal cream, or a medicine that is put into the vagina (suppository). This information is not intended to replace advice given to you by your health care provider. Make sure you discuss any questions you have with your health care provider. Document Revised: 03/14/2020 Document Reviewed: 03/14/2020 Elsevier Patient Education  2021 Elsevier Inc.  

## 2020-12-09 ENCOUNTER — Other Ambulatory Visit: Payer: Self-pay | Admitting: Obstetrics and Gynecology

## 2020-12-09 DIAGNOSIS — Z3009 Encounter for other general counseling and advice on contraception: Secondary | ICD-10-CM

## 2020-12-09 NOTE — Telephone Encounter (Signed)
Last AEX 02/05/20 Scheduled for AEX 02/12/21

## 2021-01-30 NOTE — Progress Notes (Signed)
25 y.o. G0P0000 Single Black or African American Not Hispanic or Latino female here for annual exam.  She is with the same partner, but he is now living out of state. He cheated.  Period Cycle (Days): 28 Period Duration (Days): 4-6 Period Pattern: Regular Menstrual Flow: Light,Moderate Menstrual Control: Tampon,Maxi pad,Panty liner Dysmenorrhea: (!) Mild Dysmenorrhea Symptoms: Crampingc/o: would like to be checked for bacterial vaginosis- no symptoms. Discussed that testing isn't recommended without symptoms.   She had negative STD testing in 10/21.   Last LMP was 01/15/21        Sexually active: Yes. The current method of family planning is OC's. Exercising: No Smoker: Yes  marijuana, no cigarettes.  Health Maintenance: Pap:  02/05/2020 Neg History of abnormal Pap: No. TDaP:  Teen yrs  Gardasil: No   reports that she has never smoked. She has never used smokeless tobacco. She reports current alcohol use. She reports previous drug use. 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   Past Medical History:  Diagnosis Date  . Allergy   . Chlamydia contact, treated 09/2016  . 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 Urinary incontinence     History reviewed. No pertinent surgical history.  Current Outpatient Medications  Medication Sig Dispense Refill  . BLISOVI FE 1/20 1-20 MG-MCG tablet TAKE 1 TABLET DAILY 84 tablet 0  . CRANBERRY PO Take by mouth.    . ELDERBERRY PO Take by mouth.    . Multiple Vitamins-Minerals (HAIR SKIN AND NAILS FORMULA PO) Take by mouth.    Marland Kitchen VITAMIN A PO Take by mouth.     No current facility-administered medications for this visit.    History reviewed. No pertinent family history.  Review of Systems  Constitutional: Negative.   HENT: Negative.   Eyes: Negative.   Respiratory: Negative.   Cardiovascular: Negative.   Gastrointestinal: Negative.   Endocrine: Negative.   Genitourinary: Negative.    Musculoskeletal: Negative.   Skin: Negative.   Allergic/Immunologic: Negative.   Neurological: Negative.   Hematological: Negative.   Psychiatric/Behavioral: Negative.     Exam:   BP 112/70 (BP Location: Right Arm, Patient Position: Sitting, Cuff Size: Normal)   Pulse 88   Ht 5\' 5"  (1.651 m)   Wt 150 lb (68 kg)   LMP 01/15/2021   SpO2 98%   BMI 24.96 kg/m   Weight change: @WEIGHTCHANGE @ Height:   Height: 5\' 5"  (165.1 cm)  Ht Readings from Last 3 Encounters:  02/12/21 5\' 5"  (1.651 m)  10/31/20 5\' 7"  (1.702 m)  07/11/20 5\' 7"  (1.702 m)    General appearance: alert, cooperative and appears stated age Head: Normocephalic, without obvious abnormality, atraumatic Neck: no adenopathy, supple, symmetrical, trachea midline and thyroid normal to inspection and palpation Lungs: clear to auscultation bilaterally Cardiovascular: regular rate and rhythm Breasts: normal appearance, no masses or tenderness Abdomen: soft, non-tender; non distended,  no masses,  no organomegaly Extremities: extremities normal, atraumatic, no cyanosis or edema Skin: Skin color, texture, turgor normal. No rashes or lesions Lymph nodes: Cervical, supraclavicular, and axillary nodes normal. No abnormal inguinal nodes palpated Neurologic: Grossly normal   Pelvic: External genitalia:  no lesions              Urethra:  normal appearing urethra with no masses, tenderness or lesions              Bartholins and Skenes: normal  Vagina: normal appearing vagina with normal color and discharge, no lesions              Cervix: no lesions               Bimanual Exam:  Uterus:  normal size, contour, position, consistency, mobility, non-tender              Adnexa: no mass, fullness, tenderness               Rectovaginal: Confirms               Anus:  normal sphincter tone, no lesions  Berna Spare chaperoned for the exam.  1. Well woman exam Discussed breast self exam Discussed calcium and vit D  intake No labs this year Declines STD testing She will check on TDAP  2. Encounter for surveillance of contraceptive pills Doing well - norethindrone-ethinyl estradiol-FE (BLISOVI FE 1/20) 1-20 MG-MCG tablet; Take 1 tablet by mouth daily.  Dispense: 84 tablet; Refill: 3  3. Immunization counseling  - HPV 9-valent vaccine,Recombinat

## 2021-02-12 ENCOUNTER — Encounter: Payer: Self-pay | Admitting: Obstetrics and Gynecology

## 2021-02-12 ENCOUNTER — Ambulatory Visit (INDEPENDENT_AMBULATORY_CARE_PROVIDER_SITE_OTHER): Payer: Managed Care, Other (non HMO) | Admitting: Obstetrics and Gynecology

## 2021-02-12 ENCOUNTER — Other Ambulatory Visit: Payer: Self-pay

## 2021-02-12 VITALS — BP 112/70 | HR 88 | Ht 65.0 in | Wt 150.0 lb

## 2021-02-12 DIAGNOSIS — Z01419 Encounter for gynecological examination (general) (routine) without abnormal findings: Secondary | ICD-10-CM

## 2021-02-12 DIAGNOSIS — Z7185 Encounter for immunization safety counseling: Secondary | ICD-10-CM

## 2021-02-12 DIAGNOSIS — Z23 Encounter for immunization: Secondary | ICD-10-CM

## 2021-02-12 DIAGNOSIS — Z3041 Encounter for surveillance of contraceptive pills: Secondary | ICD-10-CM

## 2021-02-12 MED ORDER — NORETHIN ACE-ETH ESTRAD-FE 1-20 MG-MCG PO TABS
1.0000 | ORAL_TABLET | Freq: Every day | ORAL | 3 refills | Status: DC
Start: 2021-02-12 — End: 2021-03-11

## 2021-02-12 NOTE — Patient Instructions (Signed)

## 2021-03-03 ENCOUNTER — Other Ambulatory Visit: Payer: Self-pay | Admitting: Obstetrics and Gynecology

## 2021-03-03 DIAGNOSIS — Z3041 Encounter for surveillance of contraceptive pills: Secondary | ICD-10-CM

## 2021-03-11 ENCOUNTER — Encounter: Payer: Self-pay | Admitting: Obstetrics and Gynecology

## 2021-03-11 ENCOUNTER — Other Ambulatory Visit: Payer: Self-pay

## 2021-03-11 DIAGNOSIS — Z3041 Encounter for surveillance of contraceptive pills: Secondary | ICD-10-CM

## 2021-03-11 MED ORDER — NORETHIN ACE-ETH ESTRAD-FE 1-20 MG-MCG PO TABS: 1.0000 | ORAL_TABLET | Freq: Every day | ORAL | 3 refills | Status: DC

## 2021-03-11 NOTE — Telephone Encounter (Signed)
Patient sent My Chart message needing Rx to go to [facility] instead of local pharmacy.

## 2022-01-26 ENCOUNTER — Encounter: Payer: Self-pay | Admitting: Obstetrics and Gynecology

## 2022-01-26 ENCOUNTER — Ambulatory Visit (INDEPENDENT_AMBULATORY_CARE_PROVIDER_SITE_OTHER): Payer: Managed Care, Other (non HMO) | Admitting: Obstetrics and Gynecology

## 2022-01-26 VITALS — BP 122/80 | Ht 65.0 in | Wt 160.0 lb

## 2022-01-26 DIAGNOSIS — N946 Dysmenorrhea, unspecified: Secondary | ICD-10-CM | POA: Diagnosis not present

## 2022-01-26 DIAGNOSIS — Z3041 Encounter for surveillance of contraceptive pills: Secondary | ICD-10-CM | POA: Diagnosis not present

## 2022-01-26 DIAGNOSIS — Z113 Encounter for screening for infections with a predominantly sexual mode of transmission: Secondary | ICD-10-CM

## 2022-01-26 DIAGNOSIS — N898 Other specified noninflammatory disorders of vagina: Secondary | ICD-10-CM | POA: Diagnosis not present

## 2022-01-26 LAB — WET PREP FOR TRICH, YEAST, CLUE

## 2022-01-26 MED ORDER — NORETHIN ACE-ETH ESTRAD-FE 1-20 MG-MCG PO TABS: 1.0000 | ORAL_TABLET | Freq: Every day | ORAL | 3 refills | Status: DC

## 2022-01-26 MED ORDER — IBUPROFEN 800 MG PO TABS: 800.0000 mg | ORAL_TABLET | Freq: Three times a day (TID) | ORAL | 1 refills | Status: AC | PRN

## 2022-01-26 NOTE — Progress Notes (Signed)
GYNECOLOGY  VISIT ?  ?HPI: ?26 y.o.   Single Black or African American Not Hispanic or Latino  female   ?G0P0000 with Patient's last menstrual period was 12/30/2021.   ?here for vaginal discharge color white,thick texture. The d/c is better, but now noticing an odor.  ? ?Moving to [place] for work.  ?Boyfriend is in [place], they will live together.  ? ?She is on OCP's, needs a refill. Not able to do her annual yet. ?She has monthly cycles on the pill, one month she had BTB but had some missed pills. Typically bleeds for 5 days. Saturates a pad in 5 hours. Cramps are moderate. She would like to have medication for the cramps.  ? ?GYNECOLOGIC HISTORY: ?Patient's last menstrual period was 12/30/2021. ?Contraception:condoms sometimes ?Menopausal hormone therapy: n/a ?       ?OB History   ? ? Gravida  ?0  ? Para  ?0  ? Term  ?0  ? Preterm  ?0  ? AB  ?0  ? Living  ?0  ?  ? ? SAB  ?0  ? IAB  ?0  ? Ectopic  ?0  ? Multiple  ?0  ? Live Births  ?   ?   ?  ?  ?    ? ?There are no problems to display for this patient. ? ? ?Past Medical History:  ?Diagnosis Date  ? Allergy   ? Chlamydia contact, treated 09/2016  ? 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 positive culture  ? Urinary incontinence   ? ? ?History reviewed. No pertinent surgical history. ? ?Current Outpatient Medications  ?Medication Sig Dispense Refill  ? CRANBERRY PO Take by mouth.    ? ELDERBERRY PO Take by mouth.    ? Multiple Vitamins-Minerals (HAIR SKIN AND NAILS FORMULA PO) Take by mouth.    ? norethindrone-ethinyl estradiol-FE (BLISOVI FE 1/20) 1-20 MG-MCG tablet Take 1 tablet by mouth daily. 84 tablet 3  ? VITAMIN A PO Take by mouth.    ? ?No current facility-administered medications for this visit.  ?  ? ?ALLERGIES: Patient has no known allergies. ? ?History reviewed. No pertinent family history. ? ?Social History  ? ?Socioeconomic History  ? Marital status: Single  ?  Spouse name: Not on file  ? Number of children: Not on file  ? Years of education: Not on file  ? Highest  education level: Not on file  ?Occupational History  ? Not on file  ?Tobacco Use  ? Smoking status: Never  ? Smokeless tobacco: Never  ?Vaping Use  ? Vaping Use: Never used  ?Substance and Sexual Activity  ? Alcohol use: Yes  ?  Alcohol/week: 0.0 - 3.0 standard drinks  ? Drug use: Yes  ?  Types: Marijuana  ? Sexual activity: Yes  ?  Partners: Male  ?  Birth control/protection: Condom, Pill  ?  Comment: condoms occ  ?Other Topics Concern  ? Not on file  ?Social History Narrative  ? Not on file  ? ?Social Determinants of Health  ? ?Financial Resource Strain: Not on file  ?Food Insecurity: Not on file  ?Transportation Needs: Not on file  ?Physical Activity: Not on file  ?Stress: Not on file  ?Social Connections: Not on file  ?Intimate Partner Violence: Not on file  ? ? ?Review of Systems  ?Genitourinary:   ?     Vaginal discharge  ? ?PHYSICAL EXAMINATION:   ? ?BP 122/80 (BP Location: Right Arm, Patient Position: Sitting, Cuff Size: Normal)  Ht 5\' 5"  (1.651 m)   Wt 160 lb (72.6 kg)   LMP 12/30/2021   BMI 26.63 kg/m?     ?General appearance: alert, cooperative and appears stated age ? ?Pelvic: External genitalia:  no lesions ?             Urethra:  normal appearing urethra with no masses, tenderness or lesions ?             Bartholins and Skenes: normal    ?             Vagina: normal appearing vagina with normal color and discharge, no lesions. Small amount of blood in her vagina. ?             Cervix: no lesions ?              ?Chaperone was present for exam. ? ?1. Vaginal odor ?- WET PREP FOR TRICH, YEAST, CLUE: negative ?-F/U with worsening symptoms ? ?2. Dysmenorrhea ?- ibuprofen (ADVIL) 800 MG tablet; Take 1 tablet (800 mg total) by mouth every 8 (eight) hours as needed.  Dispense: 30 tablet; Refill: 1 ? ?3. Encounter for surveillance of contraceptive pills ?- norethindrone-ethinyl estradiol-FE (BLISOVI FE 1/20) 1-20 MG-MCG tablet; Take 1 tablet by mouth daily.  Dispense: 84 tablet; Refill: 3 ?-She will  establish care in 2/20 ? ?4. Screening examination for STD (sexually transmitted disease) ?- RPR ?- HIV Antibody (routine testing w rflx) ?- SURESWAB CT/NG/T. vaginalis ? ? ? ?

## 2022-01-27 LAB — SURESWAB CT/NG/T. VAGINALIS
C. trachomatis RNA, TMA: NOT DETECTED
N. gonorrhoeae RNA, TMA: NOT DETECTED
Trichomonas vaginalis RNA: NOT DETECTED

## 2022-01-27 LAB — RPR: RPR Ser Ql: NONREACTIVE

## 2022-01-27 LAB — HIV ANTIBODY (ROUTINE TESTING W REFLEX): HIV 1&2 Ab, 4th Generation: NONREACTIVE

## 2022-12-31 ENCOUNTER — Other Ambulatory Visit: Payer: Self-pay | Admitting: Obstetrics and Gynecology

## 2022-12-31 DIAGNOSIS — Z3041 Encounter for surveillance of contraceptive pills: Secondary | ICD-10-CM

## 2022-12-31 NOTE — Telephone Encounter (Signed)
3 month supply sent, please get her scheduled for an annual exam.

## 2022-12-31 NOTE — Telephone Encounter (Signed)
Med refill request: Blisovi  Last seen: 01/26/22 Last AEX : 02/12/21 Next AEX: not scheduled Last MMG (if hormonal med) n/a Refill authorized: Please Advise?

## 2023-01-01 NOTE — Telephone Encounter (Signed)
Msg sent to appt desk.  

## 2023-01-01 NOTE — Telephone Encounter (Signed)
Abner Greenspan, [place] Scrape, CMA Spoke with pt who stated she moved to [place]; will follow-up with a gyn over there.

## 2023-03-25 ENCOUNTER — Other Ambulatory Visit: Payer: Self-pay | Admitting: Obstetrics and Gynecology

## 2023-03-25 DIAGNOSIS — Z3041 Encounter for surveillance of contraceptive pills: Secondary | ICD-10-CM

## 2023-05-05 ENCOUNTER — Telehealth: Payer: Self-pay

## 2023-05-05 DIAGNOSIS — Z3041 Encounter for surveillance of contraceptive pills: Secondary | ICD-10-CM

## 2023-05-05 NOTE — Telephone Encounter (Signed)
Former [facility] pt LVM in triage line requesting refills on medication. States that she is aware that she is overdue for her AEX. However, has since moved out of state and is currently searching for new provider and was hoping that we would consider sending temporary rx for her while she is in search of one?  Med refill request: [facility] Last AEX: 02/12/2021, last seen 01/26/2022 (pt thought this was considered her last AEX at time of call) Next AEX: nothing scheduled. Last MMG (if hormonal med): n/a Refill authorized:

## 2023-05-06 MED ORDER — NORETHIN ACE-ETH ESTRAD-FE 1-20 MG-MCG PO TABS: 1.0000 | ORAL_TABLET | Freq: Every day | ORAL | 0 refills | Status: AC

## 2023-05-06 NOTE — Telephone Encounter (Signed)
I can refill only one month of birth control.   She will need to establish care in [place] to receive any additional pills.

## 2023-05-06 NOTE — Telephone Encounter (Signed)
Spoke with patient, advised as seen below per Dr. Silva.  Patient verbalizes understanding and is agreeable.  Encounter closed.
